# Patient Record
Sex: Female | Born: 1935 | Race: White | Hispanic: No | Marital: Married | Smoking: Never smoker
Health system: Southern US, Community
[De-identification: ages and names within clinical notes are randomized; demographics above are authoritative.]

## PROBLEM LIST (undated history)

## (undated) DIAGNOSIS — K219 Gastro-esophageal reflux disease without esophagitis: Secondary | ICD-10-CM

## (undated) DIAGNOSIS — E119 Type 2 diabetes mellitus without complications: Secondary | ICD-10-CM

## (undated) DIAGNOSIS — I10 Essential (primary) hypertension: Secondary | ICD-10-CM

## (undated) HISTORY — PX: TUBAL LIGATION: SHX77

## (undated) HISTORY — DX: Gastro-esophageal reflux disease without esophagitis: K21.9

## (undated) HISTORY — PX: JOINT REPLACEMENT: SHX530

## (undated) HISTORY — DX: Type 2 diabetes mellitus without complications: E11.9

## (undated) HISTORY — DX: Essential (primary) hypertension: I10

## (undated) HISTORY — PX: THYROIDECTOMY: SHX17

## (undated) HISTORY — PX: CHOLECYSTECTOMY: SHX55

## (undated) HISTORY — PX: ABDOMINAL HYSTERECTOMY: SHX81

## (undated) HISTORY — PX: OTHER SURGICAL HISTORY: SHX169

---

## 1998-06-30 ENCOUNTER — Ambulatory Visit (HOSPITAL_COMMUNITY): Admission: RE | Admit: 1998-06-30 | Discharge: 1998-06-30 | Payer: Self-pay | Admitting: Obstetrics and Gynecology

## 1999-07-16 ENCOUNTER — Other Ambulatory Visit: Admission: RE | Admit: 1999-07-16 | Discharge: 1999-07-16 | Payer: Self-pay | Admitting: Obstetrics & Gynecology

## 2000-09-22 ENCOUNTER — Encounter (INDEPENDENT_AMBULATORY_CARE_PROVIDER_SITE_OTHER): Payer: Self-pay | Admitting: Specialist

## 2000-09-22 ENCOUNTER — Ambulatory Visit (HOSPITAL_BASED_OUTPATIENT_CLINIC_OR_DEPARTMENT_OTHER): Admission: RE | Admit: 2000-09-22 | Discharge: 2000-09-22 | Payer: Self-pay | Admitting: Plastic Surgery

## 2000-09-27 ENCOUNTER — Other Ambulatory Visit: Admission: RE | Admit: 2000-09-27 | Discharge: 2000-09-27 | Payer: Self-pay | Admitting: Obstetrics & Gynecology

## 2001-04-25 ENCOUNTER — Encounter: Admission: RE | Admit: 2001-04-25 | Discharge: 2001-04-25 | Payer: Self-pay | Admitting: Specialist

## 2001-04-25 ENCOUNTER — Encounter: Payer: Self-pay | Admitting: Specialist

## 2001-12-20 ENCOUNTER — Encounter: Admission: RE | Admit: 2001-12-20 | Discharge: 2002-03-20 | Payer: Self-pay

## 2002-03-27 ENCOUNTER — Encounter: Admission: RE | Admit: 2002-03-27 | Discharge: 2002-06-25 | Payer: Self-pay

## 2002-06-27 ENCOUNTER — Encounter: Admission: RE | Admit: 2002-06-27 | Discharge: 2002-08-21 | Payer: Self-pay

## 2002-09-26 ENCOUNTER — Encounter: Admission: RE | Admit: 2002-09-26 | Discharge: 2002-12-25 | Payer: Self-pay | Admitting: Anesthesiology

## 2002-12-27 ENCOUNTER — Encounter: Admission: RE | Admit: 2002-12-27 | Discharge: 2003-03-27 | Payer: Self-pay

## 2004-05-11 ENCOUNTER — Ambulatory Visit (HOSPITAL_COMMUNITY): Admission: RE | Admit: 2004-05-11 | Discharge: 2004-05-11 | Payer: Self-pay | Admitting: Specialist

## 2008-06-12 ENCOUNTER — Inpatient Hospital Stay (HOSPITAL_COMMUNITY): Admission: RE | Admit: 2008-06-12 | Discharge: 2008-06-14 | Payer: Self-pay | Admitting: Obstetrics and Gynecology

## 2008-06-15 ENCOUNTER — Inpatient Hospital Stay (HOSPITAL_COMMUNITY): Admission: AD | Admit: 2008-06-15 | Discharge: 2008-06-17 | Payer: Self-pay | Admitting: Obstetrics and Gynecology

## 2008-06-15 ENCOUNTER — Ambulatory Visit: Payer: Self-pay | Admitting: Critical Care Medicine

## 2008-10-25 ENCOUNTER — Ambulatory Visit (HOSPITAL_BASED_OUTPATIENT_CLINIC_OR_DEPARTMENT_OTHER): Admission: RE | Admit: 2008-10-25 | Discharge: 2008-10-25 | Payer: Self-pay | Admitting: Family Medicine

## 2008-10-25 ENCOUNTER — Ambulatory Visit: Payer: Self-pay | Admitting: Diagnostic Radiology

## 2008-11-04 ENCOUNTER — Ambulatory Visit (HOSPITAL_BASED_OUTPATIENT_CLINIC_OR_DEPARTMENT_OTHER): Admission: RE | Admit: 2008-11-04 | Discharge: 2008-11-04 | Payer: Self-pay | Admitting: Family Medicine

## 2011-04-06 NOTE — Op Note (Signed)
NAME:  Brandy Morris, Brandy Morris              ACCOUNT NO.:  [id number]   MEDICAL RECORD NO.:  [id number]          PATIENT TYPE:  INP   LOCATION:  9308                          FACILITY:  [facility]   PHYSICIAN:  Guy Sandifer. Henderson Cloud, M.D. DATE OF BIRTH:  09-07-1935   DATE OF PROCEDURE:  DATE OF DISCHARGE:                               OPERATIVE REPORT   PREOPERATIVE DIAGNOSES:  1. Recurrent pelvic relaxation.  2. Stress urinary continence.   POSTOPERATIVE DIAGNOSES:  1. Recurrent pelvic relaxation.  2. Stress urinary continence.   PROCEDURE:  Anterior vaginal repair, colpopexy, uphold anterior vaginal  graft, and TVT mid urethral sling - [place].   SURGEON:  Guy Sandifer. Henderson Cloud, MD   ANESTHESIA:  General endotracheal intubation, Tyrone Apple. Malen Gauze, MD   ESTIMATED BLOOD LOSS:  150 mL.   INDICATIONS AND CONSENT:  This patient is a 76 year old married white  female G4, P4, status post hysterectomy, status post anteroposterior  repair with complaints of feeling as though something is falling out.  She also has complaints of leaking urine.  Urodynamics are consistent  with stress urinary continence.  Anteroposterior vaginal repair with  probable grafts as well as a transvaginal tape and mid urethral sling is  discussed with the patient.  Potential risks and complications were  discussed preoperatively including but limited to infection, organ  damage, bleeding requiring transfusion of blood products if possible,  HIV and hepatitis acquisition, DVT, PE, pneumonia, fistula formation,  and recurrent prolapse.  The sling with success and failure rates,  urinary retention, prolonged catheterization, intermittent  catheterization, and possible return to the operating room has been  reviewed.  Possibly, she has delayed healing, erosion, and dyspareunia  have also been reviewed.  All questions were answered and consent is  signed on the chart.   PROCEDURE:  The patient was taken to operating room where she is  identified, placed in dorsal supine position and general anesthesia was  induced via the endotracheal intubation.  She was then placed in dorsal  lithotomy position where she was prepped, bladder straight catheterized,  and she was draped in a sterile fashion.  Examination reveals the  primarily loss of level I support anteriorly.  Posteriorly, there is  still excellent support with redundant vaginal mucosa.  Weighted  speculum was placed.  The anterior vaginal mucosa was injected with 0.5%  lidocaine and 1:200,000 epinephrine.  A small inverted T-shaped incision  was made anteriorly.  Dissection was carried out bilaterally completely  elevating the bladder off that portion of the vaginal mucosa.  This was  carried out bilaterally to the point at the ischial spines and some  sacrospinous ligaments could be palpated bilaterally.  The uphold graft  was then placed through the sacrospinous ligaments bilaterally using the  Capio needle driver.  This was done at least 1 fingerbreadth medial to  the spines bilaterally.  The dilators and then the arms of the graft  were advanced bilaterally.  A single Monocryl 0 suture was used to  attach the apical portion of the graft in the midline to the vaginal  mucosa.  The arms were  then further advanced and advanced to properly  seat the graft.  It is laying quite flat with no folds.  It is  approximated to the bladder blade, but is not overly tight.  The sheaths  were then cut bilaterally and were totally removed completely along with  the suture itself within those.  The excess were trimmed.  Anterior  vaginal mucosa was then closed in a running locking fashion with 2-0  Vicryl suture.  Next, 2 points immediately suprapubic are marked with a  marking pen about 1 finger lateral to the midline bilaterally.  These  areas were then injected with the same lidocaine-epinephrine solution.  Stab incisions were made bilaterally at this point.  Vaginally, the   suburethral vaginal mucosa was injected with the same solution.  A small  midline incision was made, and dissection was carried out bilaterally to  the urogenital diaphragm.  It should be noted that the Foley catheter  has been replaced in the bladder at this point.  The bladder has been  completely drained, and the catheter was left in place.  Then using the  [place] system, the needles are placed bilaterally starting 1-2 cm superior  to the pubis bilaterally and marching the needle tip behind the  symphysis while the needle was rotated perpendicular to the pubis  thereby directing the needle tip away from the peritoneal cavity.  As  the needle passes below the pubis bilaterally, its passage was  controlled with the examining finger and the needle was exited through  the suburethral incision.  The Foley catheter was then removed, and  cystoscopy was carried out with 70-degree cystoscope.  A 306 degrees  inspection revealed no evidence of perforation or foreign body.  A good  puff of urine was noted from the ureters bilaterally.  Cystoscope was  removed, Foley catheter was replaced, the bladder was drained, and the  catheter was left in place.  The polypropylene mesh sling was then  attached to the needle tips which were then withdrawn through the  suprapubic incisions.  The sheath was then removed.  Proper tensioning  was noted on the sling with a Kelly clamp placed below the sling can  easily be rotated perpendicular to the floor without tension.  The sling  was otherwise sling nice and flat.  The excess arms were then trimmed at  the level of the skin bilaterally and these incisions were closed with  Dermabond.  The suburethral vaginal incision was closed with running  locking 2-0 Monocryl suture.  Vagina was packed with 1-inch gauze with  estrogen cream.  All counts were correct.  The patient was awakened,  taken to recovery room in stable condition.      Guy Sandifer Henderson Cloud, M.D.   Electronically Signed     JET/MEDQ  D:  06/12/2008  T:  06/12/2008  Job:  [id number]

## 2011-04-09 NOTE — Consult Note (Signed)
[facility]  Brandy Morris:    Brandy Morris, Brandy Morris Visit Number: [id number] MRN: [id number]          Service Type: PMG Location: [facility] Attending Physician:  Sondra Come Dictated by:   Sondra Come, D.O. Proc. Date: 12/21/01 Admit Date:  12/20/2001   CC:         Javier Docker, M.D.                          Consultation Report  Dear Dr. Shelle Iron:  Thank you very much for kindly referring Brandy Morris to the [facility] for evaluation.  Ms. Rollyson was evaluated in our clinic today.  Please refer to the following for details regarding the history and physical examination and treatment plan.  CHIEF COMPLAINT:  Back and knee pain.  HISTORY OF PRESENT ILLNESS:  Brandy Morris is a pleasant 76 year old right hand dominant female who [facility] to [facility] today complaining of nonradicular low back pain as well as right significantly greater than left knee pain.  Patients records indicate that an MRI showed spondylolisthesis at L4-5 with stenosis as well as degenerative disk disease at L5-S1 and multilevel facet arthropathy.  Brandy Morris apparently underwent lumbar epidural steroid injections x3 per Dr. Ethelene Hal at [facility].  These were done in September and October 2002.  Brandy Morris states that the first shot helped her for approximately a month but the second and third injections offered no significant relief.  The Brandy Morris also has been followed by Dr. Shelle Iron for knee pain and has undergone four knee injections of some sort with some temporary relief.  She states she has been diagnosed with osteoarthritis of her knees.  She was prescribed aquatic therapy and was walking in the pool but a flare up of knee pain caused her to discontinue this.  She denies that she was instructed on any particularly stretching exercises for her lower extremities and back.  She has been tried on  multiple medications including Vicodin at one point which she is not currently taking. She has been prescribed methocarbamol which offers no relief, Ultram 50 mg which gives her some moderate relief of her symptoms.  She also has taken ibuprofen in the past with some relief.  Currently, her pain is a 6/10 on a subjective scale.  Her function and quality of life indexes have declined. Her sleep is fair.  Her pain is worse with walking, bending, and standing significantly greater than sitting.  She also notices increased pain with working.  It is improved with rest and medications.  I review the health and history form and 14 point review of systems.  PAST MEDICAL HISTORY:  Hypertension and hypothyroidism.  PAST SURGICAL HISTORY:  Hysterectomy, bladder surgery, cholecystectomy, thyroidectomy, bilateral knee surgery.  FAMILY HISTORY:  Disability, hypertension.  SOCIAL HISTORY:  Brandy Morris denies smoking or alcohol use.  She is married and not currently working.  ALLERGIES:  No known drug allergies.  MEDICATIONS:  Hydrochlorothiazide, Synthroid, Lopressor, Prevacid, Vivelle DOT, methocarbamol, Ultram, ibuprofen p.r.n.  PHYSICAL EXAMINATION  GENERAL:  Obese female in no acute distress.  VITAL SIGNS:  Blood pressure 184/82, pulse 76, respirations 20, O2 saturation 96% on room air.  BACK:  Level pelvis without gross scoliosis.  There is decreased lumbar lordosis.  Palpatory examination reveals tenderness to palpation at the upper lumbar paraspinal muscles.  Range of motion is full in all planes without  pain on flexion.  There is mild pain on extension, but slight.  Side bending is greater on the left than the right.  Rotation is symmetric.  NEUROLOGIC:  Manual muscle testing is 5/5 bilateral lower extremities. Sensory examination is intact to light touch bilateral lower extremities. Muscle stretch reflexes are 2+/4 bilateral patellar, medial hamstrings, and Achilles.  Straight leg  raise is negative bilaterally.  Pearlean Brownie is negative bilaterally.  Brandy Morris is noted to have significantly tight hamstrings on the right greater than the left.  EXTREMITIES:  Examination of the hips reveals full active and passive range of motion.  There is full range of motion at the knees bilaterally.  No effusions noted.  There is mild tenderness to palpation bilateral anserine bursae and medial knee joint space.  There is mild pretibial edema bilaterally without any heat or erythema.  Distal pulses are present and symmetric bilaterally in the lower extremities.  IMPRESSION: 1. Chronic low back pain, multifactorial.  Brandy Morris has degenerative disk    disease of the lumbar spine with L4-5 spondylolisthesis resulting in some    spinal stenosis.  She does not, however, have any symptoms consistent with    neurogenic claudication at this time.  She has a significant myofascial    component and probable component of facet arthropathy contributing to her    current symptoms. 2. Osteoarthritis of the knees bilaterally with right greater than left knee    pain.  PLAN: 1. I will gather more information to enhance our database to include    radiologic imaging studies and procedural notes in regards to patients    spine injections. 2. Will prescribe physical therapy for range of motion, stretching,    strengthening, and a flexion to neutral based lumbar stabilization program    working towards a home exercise program two to three times per week for    four weeks. 3. Will prescribe Bextra 20 mg one p.o. q.d. #30 with one refill.  Brandy Morris    instructed to discontinue ibuprofen. 4. Continue Ultram 50 mg one to two as needed for pain q.6-8h. 5. Discontinue methocarbamol. 6. Brandy Morris instructed on proper body mechanics. 7. Brandy Morris to return to clinic in one month for reevaluation.  Will consider    further interventional procedures such as facet joint blocks if Brandy Morris has    no response to the  above noted measures.  Brandy Morris was educated on the above findings and recommendations and  understands.  There were no barriers to communication. Dictated by:   Sondra Come, D.O. Attending Physician:  Sondra Come DD:  12/21/01 TD:  12/22/01 Job: [id number]

## 2011-04-09 NOTE — Discharge Summary (Signed)
NAME:  Brandy Morris, Brandy Morris              ACCOUNT NO.:  [id number]   MEDICAL RECORD NO.:  [id number]          PATIENT TYPE:  INP   LOCATION:  9307                          FACILITY:  [facility]   PHYSICIAN:  Guy Sandifer. Henderson Cloud, M.D. DATE OF BIRTH:  November 06, 1935   DATE OF ADMISSION:  06/15/2008  DATE OF DISCHARGE:  06/17/2008                               DISCHARGE SUMMARY   ADMITTING DIAGNOSES:  1. Postoperative fever status post anterior vaginal repair and      transvaginal tension-free.  2. Shortness of breath   DISCHARGE DIAGNOSES:  1. Postoperative fever status post anterior vaginal repair and      transvaginal tension-free.  2. Shortness of breath   REASON FOR ADMISSION:  This patient is a 76 year old G4, P4 who  underwent anterior vaginal repair with a cold mesh graft and TVT on June 12, 2008.  She was discharged home on June 14, 2008, with her Foley  catheter in place.  She was taking Macrobid.  She was, otherwise, doing  fine.  She states that when she went home, she was feeling okay.  She  awoke in the night with chills and a feeling of being short of breath.  Sometimes, feels a little tight in her chest and has a dry cough.  There  is no chest pain, no leg pain, no nausea and vomiting, and no swelling  of the legs.   PHYSICAL EXAMINATION:  VITAL SIGNS:  Examination in triage revealed an  admitting temperature of 102.6.  It was 101.6 at the time of  examination.  Blood pressures 147/64, pulse was 97 and regular,  respiratory rate is 20.  GENERAL:  The patient is smiling in no acute distress.  She has an  occasional nonproductive cough.  LUNGS:  There are decreased lung sounds at the bases bilaterally.   LABORATORY DATA:  A chest CT to rule out pneumonia and pulmonary  embolism was felt to be nondiagnostic.  EKG was normal sinus rhythm.  White count is 17.7, hemoglobin 11.0.  CMET is essentially within normal  limits.  On room air, her arterial blood gas reveals a pH of 7.463, pCO2  of 38.1, pO2 of 53.6, and a bicarb of 25.5.  Blood cultures x2 were  done.   HOSPITAL COURSE:  The patient was admitted to the hospital.  IV fluids  were started.  About an hour after admission, temperature is 99.9 and  vital signs were stable.  O2 saturations were 100% on O2 mask.  Chest x-  ray was obtained.  Telephone conversation with Dr. Tyron Russell of Radiology  feels the x-ray is consistent with a probable slight pneumonia on the  right.  This was then discussed with the pulmonary intensivist on call.  Where she given for a sputum culture as well as Gram stain.  Urine  culture was sent.  Blood cultures were already been done.  Antibiotic  coverage was started with the recommendation of Pulmonary with  vancomycin and Avelox.  Pulmonary consultation was ordered to follow  later that day.  Later the same day, pulmonary consultation was  obtained.  Consult was suspicious of pneumonia.  There was a question of  pulmonary edema and a component of airway disease.  The pulmonary  embolism was doubtful in their opinion.  Urine was sent for pneumococcal  and Legionella antigens.  They cancelled the order for a repeat CT.  Antibiotics were continued.  IV fluids were restricted and the patient  was given a dose of IV Lasix.  On June 16, 2008, chest x-ray was  repeated.  There was a right lower lobe infiltrate.  Otherwise, there is  good aeration and decreased signs of pulmonary edema.  White count  decreased to 15.2 and hemoglobin was 9.4.  Urine antigen and blood  cultures were negative.  The impression was mild volume excess and a  right lower lobe postoperative pneumonia.  Antibiotics were changed to  oral Avelox and the vancomycin was discontinued.  Overall, the patient  was feeling better and her catheter was removed.  On June 17, 2008, she  was feeling better.  Vital signs were stable.  She was afebrile.  O2  saturation was 96% on room air.  She was voiding with residual volumes  less than  300 mL.  After another examination with Pulmonary, the patient  was discharged home.   CONDITION ON DISCHARGE:  Good.   DIET:  Regular as tolerated.   ACTIVITY:  No lifting, no operation of automobiles, and no vaginal  entry.   MEDICATIONS:  Avelox 400 mg daily.  Pain medication as needed upon  discharge previously.   FOLLOWUP:  In my office in 2 weeks as scheduled and with Pulmonary as  directed.      Guy Sandifer Henderson Cloud, M.D.  Electronically Signed     JET/MEDQ  D:  07/25/2008  T:  07/25/2008  Job:  [id number]

## 2011-04-09 NOTE — Consult Note (Signed)
   NAME:  Brandy Morris, Brandy Morris                        ACCOUNT NO.:  [id number]   MEDICAL RECORD NO.:  [id number]                   PATIENT TYPE:  REC   LOCATION:  [facility]                                  FACILITY:  [facility]   PHYSICIAN:  Sondra Come, D.O.                 DATE OF BIRTH:  06/14/1935   DATE OF CONSULTATION:  DATE OF DISCHARGE:                  PHYSICAL MEDICINE & REHABILITATION CONSULTATION   REASON FOR CONSULTATION:  The patient returns to clinic today for re-  evaluation.  She was last seen on 04/26/02.  Overall, she is doing quite well,  in terms of her low back pain and bilateral knee pain.  Her pain today is a  2/10 on a subjective scale.  She states that I am feeling better.  She  continues on Bextra 20 mg q.d. and Ultracet two q.a.m. and two in the  afternoon which seems to control her pain.  She denies any new neurologic  complaints.  I review health and history form and 14 point review of  systems.  The patient's function and quality of life indices have improve,  and her sleep is good.   PHYSICAL EXAMINATION:  GENERAL:  A healthy female in no acute distress.  VITAL SIGNS:  Blood pressure 156/47, pulse 55, respirations 18, O2  saturation 95% on room air.  NEUROLOGIC:  Manual muscle testing is 5/5 bilateral lower extremities.  Sensory examination is intact to light touch bilateral lower extremities.  Muscle stretch reflexes are symmetric bilateral lower extremities.  Minimal  tenderness to palpation bilateral lumbar paraspinal muscles.  Range of  motion of the lumbar spine is full in all planes.  Full range of motion of  bilateral knees with minimal tenderness at the medial joint spaces  bilaterally.  There is no ligamentous instability noted today.   IMPRESSION:  1. Chronic low back pain with underlying degenerative disk disease and     spinal stenosis without myelopathy, stable.  2. Osteoarthritis of bilateral knees, stable.   PLAN:  1. Continue Bextra 20 mg q.d.  2. Continue Ultracet two p.o. q.a.m. and two p.o. q.p.m. as needed.  3. Continue home exercise program.  4. The patient is to return to clinic on an as needed basis.   The patient was educated on the above findings and recommendations and  understands.  There were no barriers to communication.                                               Sondra Come, D.O.    JJW/MEDQ  D:  06/28/2002  T:  07/01/2002  Job:  [id number]

## 2011-04-09 NOTE — Consult Note (Signed)
[facility]  Patient:    Brandy Morris, Brandy Morris Visit Number: [id number] MRN: [id number]          Service Type: PMG Location: [facility] Attending Physician:  Sondra Come Dictated by:   Sondra Come, D.O. Proc. Date: 01/25/02 Admit Date:  12/20/2001   CC:         Javier Docker, M.D.   Consultation Report  [name] returns to clinic today as scheduled for reevaluation.  She was last seen on December 21, 2001.  Today [name] complains of diffuse pain in bilateral upper back, low back as well as bilateral upper and lower extremities which she gets from time to time.  She states that her right knee pain seems to be fluctuating in severity.  She will go back to physical therapy today where she is scheduled for aquatic therapy.  She states that her back feels somewhat weak.  Her pain is a 6/10 on a subjective scale.  She also relates a history of intermittent pain in her shoulders bilaterally and she describes this as a "twinge of pain every now and then."  She denies any bowel or bladder dysfunction.  Denies any paraesthesias except for chronic numbness in her toes.  She continues to take Ultram and Bextra which she states are helping.  I reviewed the health and history form and 14-point review of systems. Function and quality of life indexes remain essentially the same. Sleep is good.  PHYSICAL EXAMINATION  GENERAL:  Obese female in no acute distress.  VITAL SIGNS:  Blood pressure 156/55, pulse 72, respirations 18, O2 saturation 93% on room air.  BACK:  Palpatory examination reveals diffuse tenderness to palpation bilateral upper back and thoracolumbar paraspinals.  Patient also has some tenderness to palpation over the lateral epicondyles, gluteal muscles, and trochanteric bursae.  NEUROLOGIC:  Manual muscle testing is 5/5 bilateral upper and lower extremities.  Sensory examination is intact to light touch bilateral upper and lower extremities.   Muscle stretch reflexes are intact bilateral upper and lower extremities.  EXTREMITIES:  No heat, erythema, or edema in the upper and lower extremities.  LYMPH:  No cervical lymphadenopathy.  LABORATORIES:  The patient brings radiologic studies with her and I review the x-rays of her lumbar spine which reveal a slight scoliosis with osteopenia and facet arthropathy.  MRI of the lumbar spine was also reviewed which reveals multilevel degenerative disk changes with multilevel facet arthropathy and mild spinal stenosis at L3-4, moderate central spinal stenosis at L4-5.  I also reviewed the x-rays of her knees which revealed decreased medial compartments.  IMPRESSION: 1. Chronic low back pain, multifactorial with degenerative disk disease of the    lumbar spine and spinal stenosis without myelopathy. 2. Osteoarthritis bilateral knees, right greater than left. 3. Diffuse soft tissue pain, rule out fibromyalgia.  PLAN: 1. Continue Bextra and tramadol. 2. Recommend glucosamine and chondroitin. 3. Will obtain laboratory work-up to include thyroid studies, chemistry panel,    CBC, ANA, rheumatoid factor, ESR. 4. Continue with physical therapy. 5. Consider adding Humibid DM for the anti NMDA receptor antagonistic effect    of dextromethorphan. 6. Consider repeat lumbar epidural steroid injections versus facet blocks if    low back pain fails to improve with more conservative measures. 7. Patient to return to clinic in one month for reevaluation.  The patient was educated on the above findings and recommendations and understands.  There were no barriers to communication. Dictated by:   Quintin Alto.  Andrey Campanile, D.O. Attending Physician:  Sondra Come DD:  01/25/02 TD:  01/26/02 Job: [id number]

## 2011-04-09 NOTE — Consult Note (Signed)
[facility]  Patient:    Brandy Morris, Brandy Morris Visit Number: [id number] MRN: [id number]          Service Type: Attending:  Sondra Come, D.O. Dictated by:   Sondra Come, D.O. Proc. Date: 03/16/02                            Consultation Report  HISTORY OF PRESENT ILLNESS:  Brandy Morris returns to clinic sooner than scheduled secondary to severe pain in her left hip over the past two weeks. She complains of discomfort, especially with walking and inability to sleep on her left side.  She points to her left greater trochanter.  Her pain is an 8/10 on a subjective scale.  Her function and quality of life indices have declined somewhat with the new onset of hip pain.  She also continues to complain of pain in bilateral knees secondary to osteoarthritis and anserine bursitis.  I reviewed the health and history form and 14-point review of systems.  The patient continues to take Bextra 20 mg daily, as well as tramadol as needed.  PHYSICAL EXAMINATION:  An obese female in no acute distress.  Blood pressure 157/90, pulse 70, respirations 16, O2 saturation 95%.  Palpatory examination reveals significant tenderness to palpation over the left greater trochanter, reproducing the patients symptoms.  No new neurologic findings in the lower extremities, including motor, sensory, and reflexes.  The patient also continues to have tenderness to palpation over the anserine bursae bilaterally.  IMPRESSION: 1. Left trochanteric bursitis. 2. Osteoarthritis of bilateral knees with bilateral anserine bursitis. 3. Chronic low back pain, multifactorial, with degenerative disk disease of    the lumbar spine and spinal stenosis without myelopathy.  PLAN: 1. Trochanteric bursal steroid injection.  The procedure was described to the    patient in detail, including risks, benefits, limitations, and    alternatives.  The patient wishes to proceed.  The skin was prepped in the    usual  sterile fashion.  The trochanteric bursa was injected with 1 cc of    dexamethasone 4 mg/cc plus 1 cc of 0.25% Marcaine plus 2 cc of 1% lidocaine    using a 25 gauge, 3 inch needle.  The patient tolerated the procedure well.    Discharge instructions given.  The patient was instructed to use ice    liberally as needed tonight for rebound pain. 2. The patient is to return to the clinic in two weeks for re-evaluation and    possible bilateral pes anserine bursal steroid injections. 3. The patient is to continue Bextra 20 mg daily. 4. Ultracet one to two p.o. t.i.d. as needed, #100 with one refill.  The patient was educated on the above findings and recommendations and understands.  There were no barriers to communication. Dictated by:   Sondra Come, D.O. Attending:  Sondra Come, D.O. DD:  03/16/02 TD:  03/17/02 Job: [id number]

## 2011-04-09 NOTE — Consult Note (Signed)
NAME:  Brandy Morris, Brandy Morris                        ACCOUNT NO.:  [id number]   MEDICAL RECORD NO.:  [id number]                   PATIENT TYPE:  REC   LOCATION:  [facility]                                  FACILITY:  [facility]   PHYSICIAN:  Sondra Come, D.O.                 DATE OF BIRTH:  [birth year]   DATE OF CONSULTATION:  09/27/2002  DATE OF DISCHARGE:                                   CONSULTATION   The patient returns to clinic today for reevaluation.  She was last seen on  06/28/2002.  In the interim, she had been doing very well in terms of her low  back and bilateral knee pain until yesterday when she was running the vacuum  cleaner at home and now states that she is very sore all over, mainly in her  neck, low back, and lower extremities.  Her pain today is a 7/10 on a  subjective scale.  She has taken Bextra and Ultram intermittently over the  past few months with some improvement.  She is concerned about the cost of  the medications as she is now on Medicare and has to pay out of pocket for  her prescriptions.  We discussed this at length.  She also has not been to  aquatic therapy yet, and I encouraged her to participate in some type of  aerobic exercise activity including walking as tolerated and even stationary  bicycling.  Her function and quality of life indices remain stable.  Her  sleep remains somewhat poor at times.  I reviewed the health and history  form and 14-point Review of Systems.   PHYSICAL EXAMINATION:  GENERAL:  Obese female in no acute distress.  VITAL SIGNS:  Blood pressure is 150/58, pulse 88, respirations 18, O2  saturation 99% on room air.  MUSCULOSKELETAL:  Examination of the back reveals increased lumbar lordosis  with tenderness to palpation bilateral lumbar paraspinal muscles.  Range of  motion is limited secondary to mild discomfort.  Examination of the lower  extremities reveals no significant effusions or edema in the lower  extremities.  Range of motion of  the knees is full bilaterally with mild  discomfort in the medial joint spaces bilaterally.  No ligamentous  instability.  Neurologically intact in lower extremities including motor,  sensory, and muscle stretch reflexes are symmetric.   IMPRESSION:  1. Chronic low back pain with underlying degenerative disk disease and     spinal stenosis without myelopathy.  2. Osteoarthritis bilateral knees.   PLAN:  1. Will change Ultracet to Ultram 1 to 2 p.o. t.i.d. as needed, #100 with 2     refills.  2. Over-the-counter ibuprofen as directed as needed.  3. Continue home exercise program.  4. The patient is to return to clinic in three months as needed.   The patient was educated about findings and recommendations and understands.  No barriers to communication.  Sondra Come, D.O.    JJW/MEDQ  D:  09/27/2002  T:  09/28/2002  Job:  [id number]

## 2011-04-09 NOTE — Consult Note (Signed)
[facility]  Patient:    DALAYA, SUPPA Visit Number: [id number] MRN: [id number]          Service Type: PMG Location: [facility] Attending Physician:  Sondra Come Dictated by:   Sondra Come, D.O. Proc. Date: 04/26/02 Admit Date:  03/27/2002                            Consultation Report  Ms. Yingst returns to clinic for reevaluation.  She was last seen on March 16, 2002.  She states that her left hip pain is significantly better after the left trochanteric bursal and steroid injection.  She continues to have some mild discomfort in her knees but overall they are improved.  Her pain today is a 3/10 on a subjective scale.  Her function and quality of life indexes have improved significantly.  Her sleep fluctuates from poor to great.  She is interested in pursuing aquatic therapy for maintenance.  I reviewed the health and history form and 14 point review of systems.  She continues on Ultracet two p.o. q.a.m. and two p.o. q.p.m. as needed.  She is also continued on Bextra 20 mg daily.  PHYSICAL EXAMINATION  GENERAL:  Healthy female in no acute distress.  VITAL SIGNS:  Blood pressure 157/51, pulse 56, respirations 14, O2 saturation 95% on room air.  EXTREMITIES:  No heat, erythema, or edema in the lower extremities.  There is minimal tenderness to palpation over the left greater trochanter.  Minimal tenderness to palpation over the knees bilaterally with full range of motion. Patient does have some discomfort over the left anserine bursa.  BACK:  Minimal tenderness to palpation bilateral paraspinals.  NEUROLOGIC:  No neurologic deficits in the lower extremities including motor, sensory, and reflexes at this time.  IMPRESSION: 1. Left trochanteric bursitis, essentially resolved. 2. Osteoarthritis bilateral knees, improved. 3. Anserine bursitis, left. 4. Chronic low back pain with underlying degenerative disk disease and spinal    stenosis  without myelopathy.  PLAN: 1. Aquatic therapy for range of motion, stretching, strengthening, and low to    non-impact aerobic program two times per week for four weeks advancing to    an independent program. 2. Continue Ultracet as needed. 3. Continue Bextra as needed. 4. Instruct patient to use ice for 20 minutes b.i.d.-t.i.d. on her anserine    bursa.  If not improved, would consider local steroid injection. 5. Patient to return to clinic in two months for reevaluation.  Patient was educated on the above findings and recommendations and understands.  There were no barriers to communication. Dictated by:   Sondra Come, D.O. Attending Physician:  Sondra Come DD:  04/26/02 TD:  04/30/02 Job: [id number]

## 2011-04-09 NOTE — Consult Note (Signed)
NAME:  Brandy Morris, BRAM                        ACCOUNT NO.:  [id number]   MEDICAL RECORD NO.:  [id number]                   PATIENT TYPE:  REC   LOCATION:  [facility]                                  FACILITY:  [facility]   PHYSICIAN:  Zachary George, DO                      DATE OF BIRTH:  Jul 04, 1935   DATE OF CONSULTATION:  01/11/2003  DATE OF DISCHARGE:                                   CONSULTATION   REFERRING PHYSICIAN:  Jene Every, M.D.   REASON FOR CONSULTATION:  The patient returns to clinic today for  reevaluation.  She was last seen on September 27, 2002.  The patient continues  to complain of bilateral knee pain secondary to osteoarthritis.  She also  states that her back feels weak but is not really hurting her today.  In the  interim, she has had a coronary catheterization which she states did not  reveal any blockages.  Her pain today is 4/10 on a subjective scale.  Her  function and quality-of-life indices have improved.  Her sleep is fair.  She  seems to be doing well with her current medication regimen of Tramadol 50 mg  two b.i.d. and ibuprofen 200 mg two p.o. b.i.d.  When I saw her back in June  of 2003, I had recommended aquatic therapy and wrote her a prescription.  She still has not gone to aquatic therapy and I recommend that she pursue  this, although she has some financial concerns.  I recommend that [facility]  [facility] and she is concerned  about the temperature of the pool being too cold.  I reviewed health and  history form and 14-point review of systems.   PHYSICAL EXAMINATION:  GENERAL:  Physical examination reveals an obese  female in no acute distress.  VITAL SIGNS:  Blood pressure is 149/49, pulse 62, respirations 18, O2  saturation 97% on room air.  BACK:  Examination of the back reveals a level pelvis without scoliosis.  There is decreased lumbar lordosis.  There is minimal tenderness to  palpation in the lumbar paraspinal  muscles.  Range of motion is functional.  EXTREMITIES:  Examination of the knees does not reveal any heat, erythema or  edema.  There is full range of motion with no discomfort.  Negative  provocative maneuvers including anterior drawer, posterior drawer,  Lachman's, McMurray's.  There is no medial or lateral instability noted.  Neurologically intact in the lower extremities including motor and sensory.  Reflexes are symmetric in bilateral lower extremities.   IMPRESSION:  1. Chronic low back pain with underlying degenerative disk disease of the     lumbar spine and spinal stenosis without myelopathy.  The patient's low     back pain is improved.  2. Osteoarthritis, bilateral knees.   PLAN:  1. Continue Ultram one to  two p.o. b.i.d. as needed.  2. Continue over-the-counter ibuprofen as needed.  3. Continue home exercise program and consider aquatic therapy.  Consider     joining [facility] for aquatic exercise program.  4. Patient to return to clinic in three months for reevaluation.   Patient was educated in the above findings and recommendations and  understands.  No barriers to communication.                                               Zachary George, DO   JW/MEDQ  D:  01/11/2003  T:  01/11/2003  Job:  [id number]

## 2011-04-09 NOTE — Discharge Summary (Signed)
NAME:  Brandy Morris, Brandy Morris              ACCOUNT NO.:  [id number]   MEDICAL RECORD NO.:  [id number]          PATIENT TYPE:  INP   LOCATION:  9308                          FACILITY:  [facility]   PHYSICIAN:  Guy Sandifer. Henderson Cloud, M.D. DATE OF BIRTH:  26-Apr-1935   DATE OF ADMISSION:  06/12/2008  DATE OF DISCHARGE:  06/14/2008                               DISCHARGE SUMMARY   ADMITTING DIAGNOSES:  1. Pelvic relaxation.  2. Stress urinary incontinence.   DISCHARGE DIAGNOSES:  1. Pelvic relaxation.  2. Stress urinary incontinence.   PROCEDURE:  On June 12, 2008, is anterior vaginal repair, colpopexy,  insertion of Uphold vaginal graft, cystoscopy, and mid-urethral sling.   REASON FOR ADMISSION:  This patient is a 76 year old G4, P4 who  complains of symptoms of pelvic relaxation.  Urodynamics have been  consistent with stress urinary incontinence.  She is admitted for  surgical management.   HOSPITAL COURSE:  The patient undergoes the above procedure.  Estimated  blood loss is 150 mL.  On the evening of surgery, she is sore but  resting.  She is tolerating liquids, passing flatus.  Vital signs were  stable.  She is afebrile with a clear urine output.  On the first  postoperative day by that evening, she is feeling better.  No  complaints.  She is passing flatus and tolerating regular diet.  She is  beginning to void.  Vital signs are stable.  She is afebrile.  Hemoglobin is 10.3 and pathology is pending.  The volumes on her  residual urine is trending downward.  On the second postoperative day,  she is voiding about every 15-20 minutes.  She has no pain and only  scant bleeding, and complains of feeling constipated.  Vital signs are  stable and she is afebrile.  Abdomen is flat and soft with good bowel  sounds.  Urine culture was sent.  On further trials, the patient has  rising residual urine volumes.  She is discharged home with a Foley  catheter in place.  Instructions for catheter care were  given.  Instructions for discharge were also given.   CONDITION ON DISCHARGE:  Stable.   MEDICATIONS:  1. Percocet 5/225 mg #30 1-2 p.o. q.6 h p.r.n.  2. Macrobid #14 one p.o. b.i.d. while catheter is in place.   The patient is to call the office for problems including not limited to  heavy bleeding, persistent nausea and vomiting, increasing pain, or  temperature of 101 degrees.  Instructions for followup in the office are  also given.        Guy Sandifer Henderson Cloud, M.D.  Electronically Signed     JET/MEDQ  D:  07/25/2008  T:  07/25/2008  Job:  [id number]

## 2011-08-20 LAB — DIFFERENTIAL
Basophils Relative: 1
Eosinophils Absolute: 0.1
Eosinophils Absolute: 0.4
Eosinophils Relative: 1
Eosinophils Relative: 3
Lymphs Abs: 0.4 — ABNORMAL LOW
Lymphs Abs: 2.1
Monocytes Absolute: 0.1
Monocytes Absolute: 1.1 — ABNORMAL HIGH
Monocytes Relative: 7
Neutrophils Relative %: 76

## 2011-08-20 LAB — CBC
HCT: 29.5 — ABNORMAL LOW
HCT: 31.4 — ABNORMAL LOW
HCT: 39
Hemoglobin: 10.3 — ABNORMAL LOW
Hemoglobin: 12.7
Hemoglobin: 9.4 — ABNORMAL LOW
MCHC: 32.5
MCHC: 32.9
MCV: 96.6
Platelets: 232
Platelets: 268
Platelets: 268
RBC: 3.06 — ABNORMAL LOW
RBC: 3.2 — ABNORMAL LOW
RBC: 3.46 — ABNORMAL LOW
RDW: 15.3
RDW: 15.4
WBC: 11.3 — ABNORMAL HIGH
WBC: 12.6 — ABNORMAL HIGH
WBC: 17.7 — ABNORMAL HIGH

## 2011-08-20 LAB — URINALYSIS, ROUTINE W REFLEX MICROSCOPIC
Bilirubin Urine: NEGATIVE
Hgb urine dipstick: NEGATIVE
Ketones, ur: NEGATIVE
Protein, ur: NEGATIVE
Urobilinogen, UA: 0.2

## 2011-08-20 LAB — GLUCOSE, CAPILLARY
Glucose-Capillary: 162 — ABNORMAL HIGH
Glucose-Capillary: 173 — ABNORMAL HIGH
Glucose-Capillary: 175 — ABNORMAL HIGH

## 2011-08-20 LAB — BASIC METABOLIC PANEL
BUN: 13
Chloride: 100
GFR calc Af Amer: 58 — ABNORMAL LOW
GFR calc non Af Amer: 48 — ABNORMAL LOW
Potassium: 4.2

## 2011-08-20 LAB — CULTURE, BLOOD (ROUTINE X 2)
Culture: NO GROWTH
Culture: NO GROWTH

## 2011-08-20 LAB — LEGIONELLA ANTIGEN, URINE: Legionella Antigen, Urine: NEGATIVE

## 2011-08-20 LAB — BLOOD GAS, ARTERIAL
Acid-base deficit: 3.4 — ABNORMAL HIGH
Bicarbonate: 26.9 — ABNORMAL HIGH
pH, Arterial: 7.463 — ABNORMAL HIGH
pO2, Arterial: 53.6 — ABNORMAL LOW

## 2011-08-20 LAB — COMPREHENSIVE METABOLIC PANEL
ALT: 36 — ABNORMAL HIGH
AST: 38 — ABNORMAL HIGH
Albumin: 2.8 — ABNORMAL LOW
Alkaline Phosphatase: 84
BUN: 20
CO2: 30
Calcium: 8.6
Chloride: 99
GFR calc Af Amer: >60
GFR calc non Af Amer: 55 — ABNORMAL LOW
Glucose, Bld: 123 — ABNORMAL HIGH
Potassium: 4.3
Sodium: 137
Total Bilirubin: 0.4
Total Bilirubin: 1.1
Total Protein: 7

## 2011-08-20 LAB — PROTIME-INR
INR: 0.9
Prothrombin Time: 12.5

## 2011-08-20 LAB — URINE CULTURE
Colony Count: NO GROWTH
Culture: NO GROWTH

## 2013-10-16 ENCOUNTER — Ambulatory Visit (INDEPENDENT_AMBULATORY_CARE_PROVIDER_SITE_OTHER): Payer: Medicare Other

## 2013-10-16 VITALS — BP 189/82 | HR 66 | Resp 28 | Ht 65.0 in | Wt 277.0 lb

## 2013-10-16 DIAGNOSIS — E1140 Type 2 diabetes mellitus with diabetic neuropathy, unspecified: Secondary | ICD-10-CM

## 2013-10-16 DIAGNOSIS — E1149 Type 2 diabetes mellitus with other diabetic neurological complication: Secondary | ICD-10-CM

## 2013-10-16 DIAGNOSIS — E1142 Type 2 diabetes mellitus with diabetic polyneuropathy: Secondary | ICD-10-CM

## 2013-10-16 DIAGNOSIS — B351 Tinea unguium: Secondary | ICD-10-CM

## 2013-10-16 DIAGNOSIS — M79609 Pain in unspecified limb: Secondary | ICD-10-CM

## 2013-10-16 NOTE — Progress Notes (Signed)
  Subjective:    Patient ID: Brandy Morris, female    DOB: 1935/01/06, 78 y.o.   MRN: [id number] "I guess trim these eagle claws."  HPI Comments: N  Diabetic, long L  Debridement B/L D  2012 O  Gradually C  Gotten Worse A  None T  Tried to cut them ourselves     Review of Systems  Constitutional: Negative.   HENT: Positive for sinus pressure.   Eyes: Negative.   Respiratory: Positive for wheezing.   Cardiovascular:       Poor circulation  Gastrointestinal: Abdominal pain: reflux.       Irritable bowel  Genitourinary:       Leaky bladder  Musculoskeletal: Positive for joint swelling.       Osteoporosis  Skin:       Change in nails  Allergic/Immunologic: Negative.   Hematological: Negative.   Psychiatric/Behavioral: Negative.   All other systems reviewed and are negative.       Objective:   Physical Exam Vascular status is diminished with absent PT pulse bilateral intact dorsalis pedis pulse one over 4 bilateral. Refill timed 3-4 seconds all digits skin temperature warm turgor normal no edema noted no varicosities noted neurologically epicritic and proprioceptive sensations diminished on Semmes Weinstein testing. To the digits and forefoot bilateral. Orthopedic exam unremarkable noncontributory mild digital contractures are identified no fractures or other os abnormalities noted. Dermatologically nails thick brittle friable incurvated ingrowing and tender on palpation and with enclosed shoe wear and ambulation. No open wounds or ulcerations noted current time.       Assessment & Plan:  Diabetes with peripheral neuropathy, onychomycosis criptotic ingrowing criptotic nails 1 through 5 bilateral. Plan at this time nails debrided x10 the presence of diabetes and complicating factors return for follow palliative care in 3 months for continued nail care as needed maintain accommodative shoe here at all times. Recommended topical antifungal Fungi-Nail or formula 3 to the  affected nails twice daily for 12 months duration followup in 3 months next  Alvan Dame DPM

## 2013-10-16 NOTE — Patient Instructions (Signed)
Diabetes and Foot Care Diabetes may cause you to have problems because of poor blood supply (circulation) to your feet and legs. This may cause the skin on your feet to become thinner, break easier, and heal more slowly. Your skin may become dry, and the skin may peel and crack. You may also have nerve damage in your legs and feet causing decreased feeling in them. You may not notice minor injuries to your feet that could lead to infections or more serious problems. Taking care of your feet is one of the most important things you can do for yourself.  HOME CARE INSTRUCTIONS  Wear shoes at all times, even in the house. Do not go barefoot. Bare feet are easily injured.  Check your feet daily for blisters, cuts, and redness. If you cannot see the bottom of your feet, use a mirror or ask someone for help.  Wash your feet with warm water (do not use hot water) and mild soap. Then pat your feet and the areas between your toes until they are completely dry. Do not soak your feet as this can dry your skin.  Apply a moisturizing lotion or petroleum jelly (that does not contain alcohol and is unscented) to the skin on your feet and to dry, brittle toenails. Do not apply lotion between your toes.  Trim your toenails straight across. Do not dig under them or around the cuticle. File the edges of your nails with an emery board or nail file.  Do not cut corns or calluses or try to remove them with medicine.  Wear clean socks or stockings every day. Make sure they are not too tight. Do not wear knee-high stockings since they may decrease blood flow to your legs.  Wear shoes that fit properly and have enough cushioning. To break in new shoes, wear them for just a few hours a day. This prevents you from injuring your feet. Always look in your shoes before you put them on to be sure there are no objects inside.  Do not cross your legs. This may decrease the blood flow to your feet.  If you find a minor scrape,  cut, or break in the skin on your feet, keep it and the skin around it clean and dry. These areas may be cleansed with mild soap and water. Do not cleanse the area with peroxide, alcohol, or iodine.  When you remove an adhesive bandage, be sure not to damage the skin around it.  If you have a wound, look at it several times a day to make sure it is healing.  Do not use heating pads or hot water bottles. They may burn your skin. If you have lost feeling in your feet or legs, you may not know it is happening until it is too late.  Make sure your health care provider performs a complete foot exam at least annually or more often if you have foot problems. Report any cuts, sores, or bruises to your health care provider immediately. SEEK MEDICAL CARE IF:   You have an injury that is not healing.  You have cuts or breaks in the skin.  You have an ingrown nail.  You notice redness on your legs or feet.  You feel burning or tingling in your legs or feet.  You have pain or cramps in your legs and feet.  Your legs or feet are numb.  Your feet always feel cold. SEEK IMMEDIATE MEDICAL CARE IF:   There is increasing redness,   swelling, or pain in or around a wound.  There is a red line that goes up your leg.  Pus is coming from a wound.  You develop a fever or as directed by your health care provider.  You notice a bad smell coming from an ulcer or wound. Document Released: 11/05/2000 Document Revised: 07/11/2013 Document Reviewed: 04/17/2013 [facility] Patient Information 2014 [place], [place].   Onychomycosis/Fungal Toenails  WHAT IS IT? An infection that lies within the keratin of your nail plate that is caused by a fungus.  WHY ME? Fungal infections affect all ages, sexes, races, and creeds.  There may be many factors that predispose you to a fungal infection such as age, coexisting medical conditions such as diabetes, or an autoimmune disease; stress, medications, fatigue, genetics,  etc.  Bottom line: fungus thrives in a warm, moist environment and your shoes offer such a location.  IS IT CONTAGIOUS? Theoretically, yes.  You do not want to share shoes, nail clippers or files with someone who has fungal toenails.  Walking around barefoot in the same room or sleeping in the same bed is unlikely to transfer the organism.  It is important to realize, however, that fungus can spread easily from one nail to the next on the same foot.  HOW DO WE TREAT THIS?  There are several ways to treat this condition.  Treatment may depend on many factors such as age, medications, pregnancy, liver and kidney conditions, etc.  It is best to ask your doctor which options are available to you.  1. No treatment.   Unlike many other medical concerns, you can live with this condition.  However for many people this can be a painful condition and may lead to ingrown toenails or a bacterial infection.  It is recommended that you keep the nails cut short to help reduce the amount of fungal nail. 2. Topical treatment.  These range from herbal remedies to prescription strength nail lacquers.  About 40-50% effective, topicals require twice daily application for approximately 9 to 12 months or until an entirely new nail has grown out.  The most effective topicals are medical grade medications available through physicians offices. 3. Oral antifungal medications.  With an 80-90% cure rate, the most common oral medication requires 3 to 4 months of therapy and stays in your system for a year as the new nail grows out.  Oral antifungal medications do require blood work to make sure it is a safe drug for you.  A liver function panel will be performed prior to starting the medication and after the first month of treatment.  It is important to have the blood work performed to avoid any harmful side effects.  In general, this medication safe but blood work is required. 4. Laser Therapy.  This treatment is performed by applying  a specialized laser to the affected nail plate.  This therapy is noninvasive, fast, and non-painful.  It is not covered by insurance and is therefore, out of pocket.  The results have been very good with a 80-95% cure rate.  The [facility] is the only practice in the area to offer this therapy. 5. Permanent Nail Avulsion.  Removing the entire nail so that a new nail will not grow back. 6. Fungi-Nail or formula 3 topical antifungal skin be applied to the affected nails twice daily for 12 months duration

## 2017-06-24 ENCOUNTER — Encounter (HOSPITAL_BASED_OUTPATIENT_CLINIC_OR_DEPARTMENT_OTHER): Payer: Self-pay

## 2019-11-27 ENCOUNTER — Inpatient Hospital Stay (HOSPITAL_COMMUNITY): Payer: Medicare HMO

## 2019-11-27 ENCOUNTER — Encounter (HOSPITAL_COMMUNITY): Payer: Self-pay

## 2019-11-27 ENCOUNTER — Other Ambulatory Visit: Payer: Self-pay

## 2019-11-27 ENCOUNTER — Emergency Department (HOSPITAL_COMMUNITY): Payer: Medicare HMO

## 2019-11-27 ENCOUNTER — Inpatient Hospital Stay (HOSPITAL_COMMUNITY)
Admission: EM | Admit: 2019-11-27 | Discharge: 2019-12-01 | DRG: 177 | Disposition: A | Discharge status: Home Health | Payer: Medicare HMO | Attending: Student | Admitting: Student

## 2019-11-27 DIAGNOSIS — E890 Postprocedural hypothyroidism: Secondary | ICD-10-CM | POA: Diagnosis present

## 2019-11-27 DIAGNOSIS — E860 Dehydration: Secondary | ICD-10-CM | POA: Diagnosis present

## 2019-11-27 DIAGNOSIS — N1831 Chronic kidney disease, stage 3a: Secondary | ICD-10-CM | POA: Diagnosis present

## 2019-11-27 DIAGNOSIS — U071 COVID-19: Principal | ICD-10-CM | POA: Diagnosis present

## 2019-11-27 DIAGNOSIS — Z885 Allergy status to narcotic agent status: Secondary | ICD-10-CM

## 2019-11-27 DIAGNOSIS — J189 Pneumonia, unspecified organism: Secondary | ICD-10-CM | POA: Diagnosis not present

## 2019-11-27 DIAGNOSIS — E039 Hypothyroidism, unspecified: Secondary | ICD-10-CM | POA: Diagnosis not present

## 2019-11-27 DIAGNOSIS — J9601 Acute respiratory failure with hypoxia: Secondary | ICD-10-CM

## 2019-11-27 DIAGNOSIS — L89312 Pressure ulcer of right buttock, stage 2: Secondary | ICD-10-CM | POA: Diagnosis present

## 2019-11-27 DIAGNOSIS — E1121 Type 2 diabetes mellitus with diabetic nephropathy: Secondary | ICD-10-CM | POA: Diagnosis not present

## 2019-11-27 DIAGNOSIS — E1122 Type 2 diabetes mellitus with diabetic chronic kidney disease: Secondary | ICD-10-CM | POA: Diagnosis present

## 2019-11-27 DIAGNOSIS — E1165 Type 2 diabetes mellitus with hyperglycemia: Secondary | ICD-10-CM | POA: Diagnosis present

## 2019-11-27 DIAGNOSIS — I10 Essential (primary) hypertension: Secondary | ICD-10-CM | POA: Diagnosis not present

## 2019-11-27 DIAGNOSIS — R0600 Dyspnea, unspecified: Secondary | ICD-10-CM | POA: Diagnosis not present

## 2019-11-27 DIAGNOSIS — Z79890 Hormone replacement therapy (postmenopausal): Secondary | ICD-10-CM | POA: Diagnosis not present

## 2019-11-27 DIAGNOSIS — R5381 Other malaise: Secondary | ICD-10-CM | POA: Diagnosis not present

## 2019-11-27 DIAGNOSIS — Z794 Long term (current) use of insulin: Secondary | ICD-10-CM

## 2019-11-27 DIAGNOSIS — N179 Acute kidney failure, unspecified: Secondary | ICD-10-CM | POA: Diagnosis present

## 2019-11-27 DIAGNOSIS — Z791 Long term (current) use of non-steroidal anti-inflammatories (NSAID): Secondary | ICD-10-CM | POA: Diagnosis not present

## 2019-11-27 DIAGNOSIS — Z6841 Body Mass Index (BMI) 40.0 and over, adult: Secondary | ICD-10-CM

## 2019-11-27 DIAGNOSIS — K219 Gastro-esophageal reflux disease without esophagitis: Secondary | ICD-10-CM | POA: Diagnosis present

## 2019-11-27 DIAGNOSIS — J1282 Pneumonia due to coronavirus disease 2019: Secondary | ICD-10-CM | POA: Diagnosis present

## 2019-11-27 DIAGNOSIS — K2100 Gastro-esophageal reflux disease with esophagitis, without bleeding: Secondary | ICD-10-CM

## 2019-11-27 DIAGNOSIS — J9600 Acute respiratory failure, unspecified whether with hypoxia or hypercapnia: Secondary | ICD-10-CM | POA: Diagnosis present

## 2019-11-27 DIAGNOSIS — Z91040 Latex allergy status: Secondary | ICD-10-CM | POA: Diagnosis not present

## 2019-11-27 DIAGNOSIS — Z7984 Long term (current) use of oral hypoglycemic drugs: Secondary | ICD-10-CM

## 2019-11-27 DIAGNOSIS — Z79899 Other long term (current) drug therapy: Secondary | ICD-10-CM

## 2019-11-27 DIAGNOSIS — L8990 Pressure ulcer of unspecified site, unspecified stage: Secondary | ICD-10-CM | POA: Insufficient documentation

## 2019-11-27 DIAGNOSIS — I890 Lymphedema, not elsewhere classified: Secondary | ICD-10-CM | POA: Diagnosis present

## 2019-11-27 DIAGNOSIS — I129 Hypertensive chronic kidney disease with stage 1 through stage 4 chronic kidney disease, or unspecified chronic kidney disease: Secondary | ICD-10-CM | POA: Diagnosis present

## 2019-11-27 DIAGNOSIS — L8992 Pressure ulcer of unspecified site, stage 2: Secondary | ICD-10-CM | POA: Diagnosis not present

## 2019-11-27 DIAGNOSIS — R7989 Other specified abnormal findings of blood chemistry: Secondary | ICD-10-CM | POA: Diagnosis not present

## 2019-11-27 DIAGNOSIS — N183 Chronic kidney disease, stage 3 unspecified: Secondary | ICD-10-CM

## 2019-11-27 LAB — CBC WITH DIFFERENTIAL/PLATELET
Abs Immature Granulocytes: 0.08 10*3/uL — ABNORMAL HIGH (ref 0.00–0.07)
Basophils Absolute: 0 10*3/uL (ref 0.0–0.1)
Basophils Relative: 0 %
Eosinophils Absolute: 0.4 10*3/uL (ref 0.0–0.5)
Eosinophils Relative: 3 %
HCT: 40 % (ref 36.0–46.0)
Hemoglobin: 12.4 g/dL (ref 12.0–15.0)
Immature Granulocytes: 1 %
Lymphocytes Relative: 20 %
Lymphs Abs: 2.6 10*3/uL (ref 0.7–4.0)
MCH: 29.9 pg (ref 26.0–34.0)
MCHC: 31 g/dL (ref 30.0–36.0)
MCV: 96.4 fL (ref 80.0–100.0)
Monocytes Absolute: 0.7 10*3/uL (ref 0.1–1.0)
Monocytes Relative: 5 %
Neutro Abs: 9.1 10*3/uL — ABNORMAL HIGH (ref 1.7–7.7)
Neutrophils Relative %: 71 %
Platelets: 185 10*3/uL (ref 150–400)
RBC: 4.15 MIL/uL (ref 3.87–5.11)
RDW: 14.8 % (ref 11.5–15.5)
WBC: 12.9 10*3/uL — ABNORMAL HIGH (ref 4.0–10.5)
nRBC: 0 % (ref 0.0–0.2)

## 2019-11-27 LAB — BRAIN NATRIURETIC PEPTIDE: B Natriuretic Peptide: 50.5 pg/mL (ref 0.0–100.0)

## 2019-11-27 LAB — FERRITIN: Ferritin: 43 ng/mL (ref 11–307)

## 2019-11-27 LAB — COMPREHENSIVE METABOLIC PANEL
ALT: 18 U/L (ref 0–44)
AST: 36 U/L (ref 15–41)
Albumin: 3 g/dL — ABNORMAL LOW (ref 3.5–5.0)
Alkaline Phosphatase: 217 U/L — ABNORMAL HIGH (ref 38–126)
Anion gap: 9 (ref 5–15)
BUN: 18 mg/dL (ref 8–23)
CO2: 30 mmol/L (ref 22–32)
Calcium: 8.5 mg/dL — ABNORMAL LOW (ref 8.9–10.3)
Chloride: 102 mmol/L (ref 98–111)
Creatinine, Ser: 1.08 mg/dL — ABNORMAL HIGH (ref 0.44–1.00)
GFR calc Af Amer: 55 mL/min — ABNORMAL LOW (ref >60)
GFR calc non Af Amer: 48 mL/min — ABNORMAL LOW (ref >60)
Glucose, Bld: 85 mg/dL (ref 70–99)
Potassium: 4.3 mmol/L (ref 3.5–5.1)
Sodium: 141 mmol/L (ref 135–145)
Total Bilirubin: 0.7 mg/dL (ref 0.3–1.2)
Total Protein: 7.2 g/dL (ref 6.5–8.1)

## 2019-11-27 LAB — TRIGLYCERIDES: Triglycerides: 67 mg/dL (ref <150)

## 2019-11-27 LAB — D-DIMER, QUANTITATIVE: D-Dimer, Quant: 9.15 ug/mL-FEU — ABNORMAL HIGH (ref 0.00–0.50)

## 2019-11-27 LAB — LACTATE DEHYDROGENASE: LDH: 213 U/L — ABNORMAL HIGH (ref 98–192)

## 2019-11-27 LAB — POC SARS CORONAVIRUS 2 AG -  ED: SARS Coronavirus 2 Ag: NEGATIVE

## 2019-11-27 LAB — PROCALCITONIN: Procalcitonin: <0.1 ng/mL

## 2019-11-27 LAB — LACTIC ACID, PLASMA: Lactic Acid, Venous: 0.9 mmol/L (ref 0.5–1.9)

## 2019-11-27 LAB — CBG MONITORING, ED: Glucose-Capillary: 111 mg/dL — ABNORMAL HIGH (ref 70–99)

## 2019-11-27 LAB — C-REACTIVE PROTEIN: CRP: 9.2 mg/dL — ABNORMAL HIGH (ref <1.0)

## 2019-11-27 LAB — FIBRINOGEN: Fibrinogen: 337 mg/dL (ref 210–475)

## 2019-11-27 LAB — SARS CORONAVIRUS 2 (TAT 6-24 HRS): SARS Coronavirus 2: POSITIVE — AB

## 2019-11-27 MED ORDER — PANTOPRAZOLE SODIUM 40 MG PO TBEC
40.0000 mg | DELAYED_RELEASE_TABLET | Freq: Every day | ORAL | Status: DC
  Administered 2019-11-28 – 2019-12-01 (×4): 40 mg via ORAL
  Filled 2019-11-27 (×4): qty 1

## 2019-11-27 MED ORDER — CARVEDILOL 12.5 MG PO TABS
12.5000 mg | ORAL_TABLET | Freq: Every day | ORAL | Status: DC
  Administered 2019-11-27 – 2019-11-28 (×2): 12.5 mg via ORAL
  Filled 2019-11-27 (×3): qty 1

## 2019-11-27 MED ORDER — INSULIN ASPART 100 UNIT/ML ~~LOC~~ SOLN
0.0000 [IU] | Freq: Three times a day (TID) | SUBCUTANEOUS | Status: DC
  Administered 2019-11-28: 18:00:00 2 [IU] via SUBCUTANEOUS
  Administered 2019-11-28: 3 [IU] via SUBCUTANEOUS
  Administered 2019-11-28: 10:00:00 2 [IU] via SUBCUTANEOUS
  Administered 2019-11-29 – 2019-11-30 (×3): 1 [IU] via SUBCUTANEOUS
  Administered 2019-11-30: 17:00:00 2 [IU] via SUBCUTANEOUS
  Filled 2019-11-27: qty 0.09

## 2019-11-27 MED ORDER — SODIUM CHLORIDE 0.9 % IV SOLN
1000.0000 mL | INTRAVENOUS | Status: DC
  Administered 2019-11-27: 14:00:00 1000 mL via INTRAVENOUS

## 2019-11-27 MED ORDER — FERROUS SULFATE 325 (65 FE) MG PO TABS
325.0000 mg | ORAL_TABLET | Freq: Two times a day (BID) | ORAL | Status: DC
  Administered 2019-11-28 – 2019-12-01 (×7): 325 mg via ORAL
  Filled 2019-11-27 (×8): qty 1

## 2019-11-27 MED ORDER — ASCORBIC ACID 500 MG PO TABS
500.0000 mg | ORAL_TABLET | Freq: Every day | ORAL | Status: DC
  Administered 2019-11-28 – 2019-12-01 (×4): 500 mg via ORAL
  Filled 2019-11-27 (×4): qty 1

## 2019-11-27 MED ORDER — SODIUM CHLORIDE 0.9 % IV SOLN
500.0000 mg | INTRAVENOUS | Status: DC
  Administered 2019-11-27 – 2019-11-28 (×2): 500 mg via INTRAVENOUS
  Filled 2019-11-27 (×2): qty 500

## 2019-11-27 MED ORDER — DEXAMETHASONE SODIUM PHOSPHATE 10 MG/ML IJ SOLN
6.0000 mg | Freq: Every day | INTRAMUSCULAR | Status: DC
  Administered 2019-11-27 – 2019-12-01 (×5): 6 mg via INTRAVENOUS
  Filled 2019-11-27 (×5): qty 1

## 2019-11-27 MED ORDER — AMLODIPINE BESYLATE 5 MG PO TABS
5.0000 mg | ORAL_TABLET | Freq: Every day | ORAL | Status: DC
  Administered 2019-11-28 – 2019-12-01 (×4): 5 mg via ORAL
  Filled 2019-11-27 (×4): qty 1

## 2019-11-27 MED ORDER — HYDRALAZINE HCL 20 MG/ML IJ SOLN
10.0000 mg | Freq: Once | INTRAMUSCULAR | Status: AC
  Administered 2019-11-27: 21:00:00 10 mg via INTRAVENOUS
  Filled 2019-11-27: qty 0.5

## 2019-11-27 MED ORDER — ZINC SULFATE 220 (50 ZN) MG PO CAPS
220.0000 mg | ORAL_CAPSULE | Freq: Every day | ORAL | Status: DC
  Administered 2019-11-28 – 2019-12-01 (×4): 220 mg via ORAL
  Filled 2019-11-27 (×4): qty 1

## 2019-11-27 MED ORDER — INSULIN ASPART 100 UNIT/ML ~~LOC~~ SOLN
0.0000 [IU] | Freq: Every day | SUBCUTANEOUS | Status: DC
  Filled 2019-11-27: qty 0.05

## 2019-11-27 MED ORDER — SODIUM CHLORIDE 0.9 % IV SOLN: 2.0000 g | INTRAVENOUS | Status: DC

## 2019-11-27 MED ORDER — SODIUM CHLORIDE 0.9 % IV SOLN
2.0000 g | INTRAVENOUS | Status: DC
  Administered 2019-11-27 – 2019-11-28 (×2): 2 g via INTRAVENOUS
  Filled 2019-11-27: qty 20
  Filled 2019-11-27: qty 2

## 2019-11-27 MED ORDER — LISINOPRIL 20 MG PO TABS
40.0000 mg | ORAL_TABLET | Freq: Every evening | ORAL | Status: DC
  Administered 2019-11-27: 40 mg via ORAL
  Filled 2019-11-27: qty 2

## 2019-11-27 MED ORDER — SODIUM CHLORIDE (PF) 0.9 % IJ SOLN
INTRAMUSCULAR | Status: AC
  Filled 2019-11-27: qty 50

## 2019-11-27 MED ORDER — SODIUM CHLORIDE 0.9 % IV SOLN
200.0000 mg | Freq: Once | INTRAVENOUS | Status: AC
  Administered 2019-11-27: 200 mg via INTRAVENOUS
  Filled 2019-11-27: qty 40
  Filled 2019-11-27: qty 200

## 2019-11-27 MED ORDER — LEVOTHYROXINE SODIUM 50 MCG PO TABS
150.0000 ug | ORAL_TABLET | Freq: Every evening | ORAL | Status: DC
  Administered 2019-11-27 – 2019-11-30 (×4): 150 ug via ORAL
  Filled 2019-11-27 (×5): qty 1

## 2019-11-27 MED ORDER — IOHEXOL 350 MG/ML SOLN
100.0000 mL | Freq: Once | INTRAVENOUS | Status: AC | PRN
  Administered 2019-11-27: 22:00:00 100 mL via INTRAVENOUS

## 2019-11-27 MED ORDER — SODIUM CHLORIDE 0.9 % IV SOLN: 500.0000 mg | INTRAVENOUS | Status: DC

## 2019-11-27 MED ORDER — SODIUM CHLORIDE 0.9 % IV SOLN
100.0000 mg | Freq: Every day | INTRAVENOUS | Status: AC
  Administered 2019-11-28 – 2019-12-01 (×4): 100 mg via INTRAVENOUS
  Filled 2019-11-27 (×4): qty 100

## 2019-11-27 MED ORDER — SODIUM CHLORIDE 0.9 % IV SOLN
1000.0000 mL | INTRAVENOUS | Status: DC
  Administered 2019-11-27: 17:00:00 1000 mL via INTRAVENOUS

## 2019-11-27 NOTE — ED Notes (Signed)
Given food.

## 2019-11-27 NOTE — H&P (Signed)
History and Physical        Hospital Admission Note Date: 11/27/2019  Patient name: Brandy Morris Medical record number: [id number] Date of birth: 01-07-1935 Age: 85 y.o. Gender: female  PCP: Karleen Hampshire., MD    Patient coming from: Drs. Office  I have reviewed all records in the [facility].    Chief Complaint:  Shortness of breath, productive cough for the last 2 weeks  HPI: Patient is a 85 year old female with history of diabetes mellitus, hypertension, GERD, lower extremity lymphedema presented from her doctor's office for shortness of breath with productive cough.  Her O2 sats were found to be 87% on room air, not on O2 at home.  Per patient, she has exposure to the Covid, daughter has Covid and other family members as well.  She had gone to wound care for her lower extremity lymphedema with her daughter about 3 weeks ago.  In the last 2 weeks, patient has been feeling shortness of breath, progressively worsening with coughing.  She did have subjective fevers and chills but no chest pain, no nausea vomiting or diarrhea. Of note, patient's husband has Covid+ and is currently being admitted Point-of-care Covid test for the patient is negative, confirmatory test is pending  ED work-up/course: Temp 98.1, respiratory 20, pulse 71, BP 168/78, O2 sats currently 95% on 2 L Sodium 141, potassium 4.3, creatinine 1.08, alk phos 217, AST 36, ALT 18, BNP 15.5, LDH elevated 213, ferritin 43, CRP 9.2, procalcitonin <0.1, D-dimer 9.15, fibrinogen 337  Chest x-ray showed ill-defined opacity medial right base, likely due to developing pneumonia lungs elsewhere clear cardiac silhouette normal  Review of Systems: Positives marked in 'bold' Constitutional: + fever, chills, diaphoresis, poor appetite and fatigue.  HEENT: Denies photophobia, eye pain, redness, hearing loss, ear pain,  congestion, sore throat, rhinorrhea, sneezing, mouth sores, trouble swallowing, neck pain, neck stiffness and tinnitus.   Respiratory: Please see HPI Cardiovascular: Denies chest pain, palpitations, has chronic leg swelling.  Gastrointestinal: Denies nausea, vomiting, abdominal pain, diarrhea, constipation, blood in stool and abdominal distention.  Genitourinary: Denies dysuria, urgency, frequency, hematuria, flank pain and difficulty urinating.  Musculoskeletal:+ myalgias, denies back pain, joint swelling, arthralgias and gait problem.  Skin: Denies pallor, rash and wound.  Neurological: No syncope or seizures.  + Generalized weakness Hematological: Denies adenopathy. Easy bruising, personal or family bleeding history  Psychiatric/Behavioral: Denies suicidal ideation, mood changes, confusion, nervousness, sleep disturbance and agitation  Past Medical History: Past Medical History:  Diagnosis Date  . Diabetes mellitus without complication ([place])   . GERD (gastroesophageal reflux disease)   . Hypertension     Past Surgical History:  Procedure Laterality Date  . ABDOMINAL HYSTERECTOMY    . Bladder Tack    . CHOLECYSTECTOMY    . JOINT REPLACEMENT    . THYROIDECTOMY    . TUBAL LIGATION      Medications: Prior to Admission medications   Medication Sig Start Date End Date Taking? Authorizing Provider  amLODipine (NORVASC) 5 MG tablet Take 5 mg by mouth daily. 03/14/16  Yes [provider]  aspirin 325 MG EC tablet Take 325 mg by mouth daily as needed for pain.  03/03/19  Yes [provider]  carvedilol (COREG) 6.25 MG tablet Take 12.5 mg by mouth at bedtime. 10/30/19  Yes [provider]  ferrous sulfate 325 (65 FE) MG tablet Take 325 mg by mouth 2 (two) times daily. 08/20/19  Yes [provider]  furosemide (LASIX) 40 MG tablet Take 40 mg by mouth daily. 11/25/19  Yes [provider]  glimepiride (AMARYL) 1 MG tablet Take 1 mg by mouth daily.   Yes  [provider]  Glucose Blood (BLOOD GLUCOSE TEST STRIPS) STRP 1 strip by Other route 3 (three) times a week. 01/26/19  Yes [provider]  levothyroxine (SYNTHROID) 150 MCG tablet Take 150 mcg by mouth every evening.   Yes [provider]  lisinopril (ZESTRIL) 20 MG tablet Take 40 mg by mouth every evening. 09/14/19 09/13/20 Yes [provider]  meloxicam (MOBIC) 15 MG tablet Take 15 mg by mouth daily.   Yes [provider]  nystatin (MYCOSTATIN/NYSTOP) powder Apply 1 application topically 3 (three) times daily as needed for rash. Apply to groin area tid prn for yeast in groin area. 03/24/18  Yes [provider]  omeprazole (PRILOSEC) 40 MG capsule Take 40 mg by mouth daily.   Yes [provider]  triamcinolone cream (KENALOG) 0.1 % Apply 1 application topically daily as needed. 10/16/19  Yes [provider]    Allergies:   Allergies  Allergen Reactions  . Ciprofloxacin Hives  . Codeine     Don't like the way it makes me feel.  . Latex Itching, Rash and Other (See Comments)  . Povidone-Iodine Rash    Other reaction(s): Other (See Comments) unknown     Social History:  reports that she has never smoked. She has never used smokeless tobacco. She reports that she does not drink alcohol or use drugs.  Family History: Family History  Problem Relation Age of Onset  . Pneumonia Mother     Physical Exam: Blood pressure (!) 110/48, pulse 91, temperature 98.1 F (36.7 C), temperature source Oral, resp. rate 20, SpO2 94 %. General: Alert, awake, oriented x3, in no acute distress. Eyes: pink conjunctiva,anicteric sclera, pupils equal and reactive to light and accomodation, HEENT: normocephalic, atraumatic, oropharynx clear Neck: supple, no masses or lymphadenopathy, no goiter, no bruits, no JVD CVS: Regular rate and rhythm, without murmurs, rubs or gallops. No lower extremity edema Resp : Decreased breath sound at the  bases R>L GI : Soft, nontender, nondistended, positive bowel sounds, no masses. No hepatomegaly. No hernia.  Musculoskeletal: No clubbing or cyanosis, positive pedal pulses. No contracture. ROM intact  Neuro: Grossly intact, no focal neurological deficits, strength 5/5 upper and lower extremities bilaterally Psych: alert and oriented x 3, normal mood and affect Skin: Bilateral lower extremity lymphedema with chronic venous stasis changes   LABS on Admission: I have personally reviewed all the labs and imagings below    Basic Metabolic Panel: Recent Labs  Lab 11/27/19 1351  NA 141  K 4.3  CL 102  CO2 30  GLUCOSE 85  BUN 18  CREATININE 1.08*  CALCIUM 8.5*   Liver Function Tests: Recent Labs  Lab 11/27/19 1351  AST 36  ALT 18  ALKPHOS 217*  BILITOT 0.7  PROT 7.2  ALBUMIN 3.0*   No results for input(s): LIPASE, AMYLASE in the last 168 hours. No results for input(s): AMMONIA in the last 168 hours. CBC: Recent Labs  Lab 11/27/19 1351  WBC 12.9*  NEUTROABS 9.1*  HGB 12.4  HCT 40.0  MCV 96.4  PLT 185   Cardiac Enzymes: No results for input(s): CKTOTAL, CKMB, CKMBINDEX, TROPONINI in the last 168 hours. BNP: Invalid input(s): POCBNP CBG: No results for input(s): GLUCAP in the last 168 hours.  Radiological Exams on Admission:  DG Chest Port 1 View  Result Date: 11/27/2019 CLINICAL DATA:  Cough EXAM: PORTABLE CHEST 1 VIEW COMPARISON:  May 10, 2019 FINDINGS: There is ill-defined opacity in the medial right base. The lungs elsewhere are clear. Heart size and pulmonary vascularity are normal. No adenopathy. There are surgical clips in the thyroid region. IMPRESSION: Ill-defined opacity medial right base, likely due to developing pneumonia. Lungs elsewhere clear. Cardiac silhouette normal. Status post thyroidectomy. No adenopathy evident. Electronically Signed   By: Lowella Grip III M.D.   On: 11/27/2019 14:35      EKG: Independently reviewed.  Rate 86, normal  sinus rhythm, prolonged QTC 518   Assessment/Plan Principal Problem:   Acute respiratory failure (HCC) hypoxia with community-acquired pneumonia however high likelihood of Covid viral pneumonia -Patient presenting with productive cough, shortness of breath, hypoxia.  Multiple family members has Covid, husband also Covid positive -Elevated inflammatory markers including CRP and D-dimer.   -D-dimer 9.15, obtain CT angiogram of the chest to rule out PE.  Patient has peripheral edema, rule out a DVT.  For now placed on therapeutic Lovenox until PE/DVT ruled out -Appears to be somewhat dehydrated, gentle hydration for 1 L, then saline lock.  -Awaiting confirmatory test for COVID-19  -Patient started on Rocephin, Zithromax in ED, procalcitonin less than 0.1.  For now we will continue until confirmatory test for COVID-19 results back. -Continue airborne and contact precautions -If Covid 19+, will start patient on IV Decadron and remdesivir per pharmacy   Active Problems:   GERD (gastroesophageal reflux disease) -Continue PPI    Hypothyroidism -Continue Synthroid    Diabetes mellitus ([place]), type II, NIDDM -Hold Amaryl, placed on sliding scale insulin while inpatient -Obtain hemoglobin A1c    Essential hypertension -Will restart lisinopril, Coreg, amlodipine. -  Holding Lasix for now.  Generalized debility -PT OT evaluation while inpatient   DVT prophylaxis: Currently on full dose Lovenox until DVT/PE ruled out  CODE STATUS: Discussed with the patient, full CODE STATUS  Consults called: None  Family Communication: Admission, patients condition and plan of care including tests being ordered have been discussed with the patient and daughter, Neale Burly. who indicates understanding and agree with the plan and Code Status  Admission status:   The medical decision making on this patient was of high complexity and the patient is at high risk for clinical deterioration, therefore  this is a level 3 admission.  Severity of Illness:     The appropriate patient status for this patient is INPATIENT. Inpatient status is judged to be reasonable and necessary in order to provide the required intensity of service to ensure the patient's safety. The patient's presenting symptoms, physical exam findings, and initial radiographic and laboratory data in the context of their chronic comorbidities is felt to place them at high risk for further clinical deterioration. Furthermore, it is not anticipated that the patient will be medically stable for discharge from the hospital within 2 midnights of admission. The following factors support the patient status of inpatient.   " The patient's presenting symptoms include hypoxia, shortness of breath, pneumonia on chest x-ray, Covid test pending " The worrisome physical exam findings include hypoxia, mild dehydration " The initial radiographic and laboratory data are  worrisome because of chest x-ray with pneumonia " The chronic co-morbidities include hypertension, advanced age, family members with Covid   * I certify that at the point of admission it is my clinical judgment that the patient will require inpatient hospital care spanning beyond 2 midnights from the point of admission due to high intensity of service, high risk for further deterioration and high frequency of surveillance required.*    Time Spent on Admission: 70 minutes     Brandy Morris M.D. [facility] 11/27/2019, 5:29 PM

## 2019-11-27 NOTE — ED Triage Notes (Signed)
EMS reports from MD office, Shortness of breath x 3 weeks with productive cough . Son Dx with Covid a few weeks ago. Pt states she has stopped taking "water pill" for several days because she said it doesn't work.  BP 178/100 HR 74 RR 20 Sp02 97 3ltrs Temp 97.7

## 2019-11-27 NOTE — ED Provider Notes (Signed)
[facility]-EMERGENCY DEPT Provider Note   CSN: [id number] Arrival date & time: 11/27/19  1233     History Chief Complaint  Patient presents with  . Shortness of Breath  . Cough  . Covid Exposure    Brandy Morris is a 85 y.o. female.  85 year old female presents with shortness of breath and cough x3 weeks.  For members diagnosed with Covid recently as she was at her doctor's office today for similar symptoms.  Was found to have a pulse oximetry of 87% on room air.  She does not use oxygen chronically.  Patient sent here for further evaluation.  Patient herself denies any recent fever or chest pain.  Has chronic lower extremity edema.  No vomiting or diarrhea.  No treatment use prior to arrival        Past Medical History:  Diagnosis Date  . Diabetes mellitus without complication (HCC)   . GERD (gastroesophageal reflux disease)   . Hypertension     There are no problems to display for this patient.   Past Surgical History:  Procedure Laterality Date  . ABDOMINAL HYSTERECTOMY    . Bladder Tack    . CHOLECYSTECTOMY    . JOINT REPLACEMENT    . THYROIDECTOMY    . TUBAL LIGATION       OB History   No obstetric history on file.     Family History  Problem Relation Age of Onset  . Pneumonia Mother     Social History   Tobacco Use  . Smoking status: Never Smoker  . Smokeless tobacco: Never Used  Substance Use Topics  . Alcohol use: No  . Drug use: No    Home Medications Prior to Admission medications   Medication Sig Start Date End Date Taking? Authorizing Provider  amLODipine (NORVASC) 2.5 MG tablet  09/21/13   [provider]  glimepiride (AMARYL) 1 MG tablet Take 1 mg by mouth daily.    [provider]  hydrALAZINE (APRESOLINE) 25 MG tablet Take 25 mg by mouth 3 (three) times daily.    [provider]  levothyroxine (SYNTHROID, LEVOTHROID) 175 MCG tablet Take 175 mcg by mouth daily.    [provider]  lisinopril (PRINIVIL,ZESTRIL) 20 MG tablet  10/10/13   [provider]  meloxicam (MOBIC) 15 MG tablet Take 15 mg by mouth daily.    [provider]  metFORMIN (GLUCOPHAGE) 850 MG tablet  09/05/13   [provider]  metoprolol succinate (TOPROL-XL) 100 MG 24 hr tablet Take 100 mg by mouth daily. Take with or immediately following a meal.    [provider]  omeprazole (PRILOSEC) 40 MG capsule Take 40 mg by mouth daily.    [provider]    Allergies    Codeine and Latex  Review of Systems   Review of Systems  All other systems reviewed and are negative.   Physical Exam Updated Vital Signs BP (!) 168/78   Pulse 71   Temp 98.1 F (36.7 C) (Oral)   Resp 20   SpO2 98%   Physical Exam Vitals and nursing note reviewed.  Constitutional:      General: She is not in acute distress.    Appearance: Normal appearance. She is well-developed. She is not toxic-appearing.  HENT:     Head: Normocephalic and atraumatic.  Eyes:     General: Lids are normal.     Conjunctiva/sclera: Conjunctivae normal.     Pupils: Pupils are equal, round,  and reactive to light.  Neck:     Thyroid: No thyroid mass.     Trachea: No tracheal deviation.  Cardiovascular:     Rate and Rhythm: Normal rate and regular rhythm.     Heart sounds: Normal heart sounds. No murmur. No gallop.   Pulmonary:     Effort: Pulmonary effort is normal. No respiratory distress.     Breath sounds: Normal breath sounds. No stridor. No decreased breath sounds, wheezing, rhonchi or rales.  Abdominal:     General: Bowel sounds are normal. There is no distension.     Palpations: Abdomen is soft.     Tenderness: There is no abdominal tenderness. There is no rebound.  Musculoskeletal:        General: No tenderness. Normal range of motion.     Cervical back: Normal range of motion and neck supple.  Lymphadenopathy:     Comments: 3+ bilateral lower extremity pitting edema   Skin:    General: Skin is warm and dry.     Findings: No abrasion or rash.  Neurological:     Mental Status: She is alert and oriented to person, place, and time.     GCS: GCS eye subscore is 4. GCS verbal subscore is 5. GCS motor subscore is 6.     Cranial Nerves: No cranial nerve deficit.     Sensory: No sensory deficit.  Psychiatric:        Speech: Speech normal.        Behavior: Behavior normal.     ED Results / Procedures / Treatments   Labs (all labs ordered are listed, but only abnormal results are displayed) Labs Reviewed  CULTURE, BLOOD (ROUTINE X 2)  CULTURE, BLOOD (ROUTINE X 2)  LACTIC ACID, PLASMA  LACTIC ACID, PLASMA  CBC WITH DIFFERENTIAL/PLATELET  COMPREHENSIVE METABOLIC PANEL  D-DIMER, QUANTITATIVE (NOT AT [facility])  PROCALCITONIN  LACTATE DEHYDROGENASE  FERRITIN  TRIGLYCERIDES  FIBRINOGEN  C-REACTIVE PROTEIN  POC SARS CORONAVIRUS 2 AG -  ED    EKG EKG Interpretation  Date/Time:  Tuesday November 27 2019 13:33:24 EST Ventricular Rate:  86 PR Interval:    QRS Duration: 101 QT Interval:  433 QTC Calculation: 518 R Axis:   89 Text Interpretation: Sinus rhythm Probable inferior infarct, old Prolonged QT interval Confirmed by Lacretia Leigh ([id number]) on 11/27/2019 3:40:44 PM   Radiology No results found.  Procedures Procedures (including critical care time)  Medications Ordered in ED Medications  0.9 %  sodium chloride infusion (has no administration in time range)    ED Course  I have reviewed the triage vital signs and the nursing notes.  Pertinent labs & imaging results that were available during my care of the patient were reviewed by me and considered in my medical decision making (see chart for details).    MDM Rules/Calculators/A&P                      Patient's chest x-ray consistent with pneumonia.  Rapid bedside Covid test negative.  PCR Covid test sent as well.  Patient was taken off oxygen and desatted to 87%.  Placed back on 3 L and  sats are above 92% at this time.  Started on IV antibiotics and will require inpatient admission Final Clinical Impression(s) / ED Diagnoses Final diagnoses:  None    Rx / DC Orders ED Discharge Orders    None       Lacretia Leigh, MD 11/27/19 1541

## 2019-11-27 NOTE — ED Notes (Signed)
Patient transported to CT 

## 2019-11-28 ENCOUNTER — Inpatient Hospital Stay (HOSPITAL_COMMUNITY): Payer: Medicare HMO

## 2019-11-28 DIAGNOSIS — R7989 Other specified abnormal findings of blood chemistry: Secondary | ICD-10-CM

## 2019-11-28 DIAGNOSIS — U071 COVID-19: Principal | ICD-10-CM

## 2019-11-28 DIAGNOSIS — L8990 Pressure ulcer of unspecified site, unspecified stage: Secondary | ICD-10-CM | POA: Insufficient documentation

## 2019-11-28 DIAGNOSIS — J1282 Pneumonia due to coronavirus disease 2019: Secondary | ICD-10-CM

## 2019-11-28 LAB — CBC WITH DIFFERENTIAL/PLATELET
Abs Immature Granulocytes: 0.09 10*3/uL — ABNORMAL HIGH (ref 0.00–0.07)
Basophils Absolute: 0 10*3/uL (ref 0.0–0.1)
Basophils Relative: 0 %
Eosinophils Absolute: 0 10*3/uL (ref 0.0–0.5)
Eosinophils Relative: 0 %
HCT: 40 % (ref 36.0–46.0)
Hemoglobin: 12.4 g/dL (ref 12.0–15.0)
Immature Granulocytes: 1 %
Lymphocytes Relative: 14 %
Lymphs Abs: 1.2 10*3/uL (ref 0.7–4.0)
MCH: 29.5 pg (ref 26.0–34.0)
MCHC: 31 g/dL (ref 30.0–36.0)
MCV: 95.2 fL (ref 80.0–100.0)
Monocytes Absolute: 0.1 10*3/uL (ref 0.1–1.0)
Monocytes Relative: 1 %
Neutro Abs: 7.1 10*3/uL (ref 1.7–7.7)
Neutrophils Relative %: 84 %
Platelets: 190 10*3/uL (ref 150–400)
RBC: 4.2 MIL/uL (ref 3.87–5.11)
RDW: 14.6 % (ref 11.5–15.5)
WBC: 8.5 10*3/uL (ref 4.0–10.5)
nRBC: 0 % (ref 0.0–0.2)

## 2019-11-28 LAB — COMPREHENSIVE METABOLIC PANEL
ALT: 18 U/L (ref 0–44)
AST: 34 U/L (ref 15–41)
Albumin: 3 g/dL — ABNORMAL LOW (ref 3.5–5.0)
Alkaline Phosphatase: 217 U/L — ABNORMAL HIGH (ref 38–126)
Anion gap: 11 (ref 5–15)
BUN: 18 mg/dL (ref 8–23)
CO2: 29 mmol/L (ref 22–32)
Calcium: 8.5 mg/dL — ABNORMAL LOW (ref 8.9–10.3)
Chloride: 99 mmol/L (ref 98–111)
Creatinine, Ser: 1.12 mg/dL — ABNORMAL HIGH (ref 0.44–1.00)
GFR calc Af Amer: 53 mL/min — ABNORMAL LOW (ref >60)
GFR calc non Af Amer: 46 mL/min — ABNORMAL LOW (ref >60)
Glucose, Bld: 170 mg/dL — ABNORMAL HIGH (ref 70–99)
Potassium: 4.9 mmol/L (ref 3.5–5.1)
Sodium: 139 mmol/L (ref 135–145)
Total Bilirubin: 0.7 mg/dL (ref 0.3–1.2)
Total Protein: 7 g/dL (ref 6.5–8.1)

## 2019-11-28 LAB — CBG MONITORING, ED
Glucose-Capillary: 156 mg/dL — ABNORMAL HIGH (ref 70–99)
Glucose-Capillary: 241 mg/dL — ABNORMAL HIGH (ref 70–99)
Glucose-Capillary: 166 mg/dL — ABNORMAL HIGH (ref 70–99)

## 2019-11-28 LAB — ABO/RH: ABO/RH(D): AB POS

## 2019-11-28 LAB — HEMOGLOBIN A1C
Hgb A1c MFr Bld: 6.4 % — ABNORMAL HIGH (ref 4.8–5.6)
Mean Plasma Glucose: 136.98 mg/dL

## 2019-11-28 LAB — GLUCOSE, CAPILLARY: Glucose-Capillary: 191 mg/dL — ABNORMAL HIGH (ref 70–99)

## 2019-11-28 LAB — HIV ANTIBODY (ROUTINE TESTING W REFLEX): HIV Screen 4th Generation wRfx: NONREACTIVE

## 2019-11-28 LAB — C-REACTIVE PROTEIN: CRP: 7.8 mg/dL — ABNORMAL HIGH (ref <1.0)

## 2019-11-28 LAB — D-DIMER, QUANTITATIVE: D-Dimer, Quant: 11.8 ug/mL-FEU — ABNORMAL HIGH (ref 0.00–0.50)

## 2019-11-28 LAB — MAGNESIUM: Magnesium: 2.2 mg/dL (ref 1.7–2.4)

## 2019-11-28 LAB — PHOSPHORUS: Phosphorus: 2.8 mg/dL (ref 2.5–4.6)

## 2019-11-28 LAB — FERRITIN: Ferritin: 42 ng/mL (ref 11–307)

## 2019-11-28 MED ORDER — ENOXAPARIN SODIUM 60 MG/0.6ML ~~LOC~~ SOLN
60.0000 mg | Freq: Two times a day (BID) | SUBCUTANEOUS | Status: DC
  Administered 2019-11-28 – 2019-12-01 (×7): 60 mg via SUBCUTANEOUS
  Filled 2019-11-28 (×8): qty 0.6

## 2019-11-28 MED ORDER — HYDROXYZINE HCL 25 MG PO TABS: 25.0000 mg | ORAL_TABLET | Freq: Three times a day (TID) | ORAL | Status: DC | PRN

## 2019-11-28 MED ORDER — GUAIFENESIN ER 600 MG PO TB12
600.0000 mg | ORAL_TABLET | Freq: Two times a day (BID) | ORAL | Status: DC
  Administered 2019-11-28 – 2019-12-01 (×7): 600 mg via ORAL
  Filled 2019-11-28 (×8): qty 1

## 2019-11-28 NOTE — Progress Notes (Signed)
PT Cancellation Note  Patient Details Name: Brandy Morris MRN: [id number] DOB: Sep 27, 1935   Cancelled Treatment:    Reason Eval/Treat Not Completed: Other (comment)(dopplers to r/o DVT pending. Will follow.)   Tamala Ser PT 11/28/2019  Acute Rehabilitation Services Pager [phone] Office [phone]

## 2019-11-28 NOTE — Evaluation (Addendum)
Physical Therapy Evaluation Patient Details Name: Brandy Morris MRN: [id number] DOB: 05/08/1935 Today's Date: 11/28/2019   History of Present Illness  85 year old female with history of diabetes mellitus, hypertension, GERD, lower extremity lymphedema presented from her doctor's office for shortness of breath with productive cough.  Her O2 sats were found to be 87% on room air. Pt has family members who are covid +.  Dx of covid-19, PNA.  Clinical Impression  Pt admitted with above diagnosis. Min assist for bed mobility and transfers, pt ambulated 12' with RW in room, distance limited by fatigue. HR 121 and SaO2 87% on room air walking, 94% on 2L O2 [place] at rest. Instructed pt in pursed lip breathing and in UE/LE strengthening exercises to be done independently.  Pt reports she has no assistance available at home as her husband is hospitalized with covid and her children all have covid. She stated she's not willing to go to a SNF. Recommending [facility] home health services.  Pt currently with functional limitations due to the deficits listed below (see PT Problem List). Pt will benefit from skilled PT to increase their independence and safety with mobility to allow discharge to the venue listed below.       Follow Up Recommendations SNF;Home health PT;Supervision for mobility/OOB(HHPT if pt declines SNF; pt stated her spouse (he is in ED) and children all have covid and cannot provide assistance)    Equipment Recommendations  Wheelchair (measurements PT);Wheelchair cushion (measurements PT)    Recommendations for Other Services       Precautions / Restrictions Precautions Precautions: Other (comment) Precaution Comments: monitor O2 Restrictions Weight Bearing Restrictions: No      Mobility  Bed Mobility Overal bed mobility: Needs Assistance Bed Mobility: Supine to Sit     Supine to sit: HOB elevated;Min assist     General bed mobility comments: min A to raise  trunk  Transfers Overall transfer level: Needs assistance Equipment used: Rolling walker (2 wheeled) Transfers: Sit to/from Stand Sit to Stand: Min assist;From elevated surface         General transfer comment: min A to rise  Ambulation/Gait Ambulation/Gait assistance: Min guard Gait Distance (Feet): 12 Feet Assistive device: Rolling walker (2 wheeled) Gait Pattern/deviations: Step-through pattern;Decreased stride length Gait velocity: decr   General Gait Details: 6' forward, 6' backwards with RW; SaO2 87% on room air walking, HR 121 walking, 1/4 dyspnea, no loss of balance, distance limited by fatigue  Stairs            Wheelchair Mobility    Modified Rankin (Stroke Patients Only)       Balance Overall balance assessment: Needs assistance Sitting-balance support: Feet supported;No upper extremity supported Sitting balance-Leahy Scale: Fair     Standing balance support: Single extremity supported Standing balance-Leahy Scale: Poor Standing balance comment: relies on single UE support                             Pertinent Vitals/Pain Pain Assessment: No/denies pain    Home Living Family/patient expects to be discharged to:: Private residence Living Arrangements: Spouse/significant other   Type of Home: House Home Access: Ramped entrance     Home Layout: One level Home Equipment: Bedside commode;Other (comment)(3 wheeled RW)      Prior Function Level of Independence: Independent with assistive device(s)         Comments: walks with 3 wheeled RW, sits on BSC to shower  Hand Dominance        Extremity/Trunk Assessment   Upper Extremity Assessment Upper Extremity Assessment: RUE deficits/detail RUE Deficits / Details: shoulder elevation ~35* AROM ("this shoulder is shot")    Lower Extremity Assessment Lower Extremity Assessment: Generalized weakness(B knee ext -4/5)    Cervical / Trunk Assessment Cervical / Trunk  Assessment: Normal  Communication   Communication: No difficulties  Cognition Arousal/Alertness: Awake/alert Behavior During Therapy: WFL for tasks assessed/performed Overall Cognitive Status: Within Functional Limits for tasks assessed                                        General Comments      Exercises General Exercises - Lower Extremity Ankle Circles/Pumps: AROM;Both;10 reps;Seated Long Arc Quad: AROM;Both;10 reps;Seated Shoulder Exercises Shoulder Flexion: AROM;Left;5 reps;Seated   Assessment/Plan    PT Assessment Patient needs continued PT services  PT Problem List Decreased strength;Decreased activity tolerance;Decreased mobility;Cardiopulmonary status limiting activity       PT Treatment Interventions Gait training;Functional mobility training;Therapeutic activities;Therapeutic exercise;Patient/family education    PT Goals (Current goals can be found in the Care Plan section)  Acute Rehab PT Goals Patient Stated Goal: to go home PT Goal Formulation: With patient Time For Goal Achievement: 12/12/19 Potential to Achieve Goals: Good    Frequency Min 3X/week   Barriers to discharge        Co-evaluation               AM-PAC PT "6 Clicks" Mobility  Outcome Measure Help needed turning from your back to your side while in a flat bed without using bedrails?: A Little Help needed moving from lying on your back to sitting on the side of a flat bed without using bedrails?: A Little Help needed moving to and from a bed to a chair (including a wheelchair)?: A Little Help needed standing up from a chair using your arms (e.g., wheelchair or bedside chair)?: A Little Help needed to walk in hospital room?: A Little Help needed climbing 3-5 steps with a railing? : A Lot 6 Click Score: 17    End of Session Equipment Utilized During Treatment: Gait belt;Oxygen Activity Tolerance: Patient limited by fatigue Patient left: in bed;with call bell/phone  within reach Nurse Communication: Mobility status PT Visit Diagnosis: Difficulty in walking, not elsewhere classified (R26.2);Muscle weakness (generalized) (M62.81)    Time: 3267-1245 PT Time Calculation (min) (ACUTE ONLY): 37 min   Charges:   PT Evaluation $PT Eval Moderate Complexity: 1 Mod PT Treatments $Gait Training: 8-22 mins       Ralene Bathe Kistler PT 11/28/2019  Acute Rehabilitation Services Pager [phone] Office [phone]

## 2019-11-28 NOTE — Progress Notes (Signed)
PROGRESS NOTE  Brandy Morris:[id number] DOB: 1935-11-06 DOA: 11/27/2019 PCP: Brandy Morris., MD  Brief History:  85 year old female with history of diabetes mellitus, hypertension, GERD, lower extremity lymphedema presented from her doctor's office for shortness of breath with productive cough.  Her O2 sats were found to be 87% on room air, not on O2 at home.  Per patient, she has exposure to the Covid, daughter has Covid and other family members as well.  She had gone to wound care for her lower extremity lymphedema with her daughter about 3 weeks ago.  In the last 2 weeks, patient has been feeling shortness of breath, progressively worsening with coughing.  She did have subjective fevers and chills but no chest pain, no nausea vomiting or diarrhea. SARS-CoV2 RT PCR is positive.  She was started on remdesivir and dexamethasone  Assessment/Plan: Acute respiratory failure with hypoxia secondary to COVID-19 pneumonia -Presently stable on 2 L nasal cannula -Continue dexamethasone and remdesivir -CRP 9.2>>> 7.8 -Ferritin 43>>> 42 -D-dimer 11.8 -Procalcitonin <0.10 -CTA chest negative for PE; multifocal pneumonia -Continue to cycle inflammatory markers -Discontinue ceftriaxone and azithromycin    GERD (gastroesophageal reflux disease) -Continue PPI    Hypothyroidism -Continue Synthroid    Diabetes mellitus (HCC), type II, NIDDM -Hold Amaryl, placed on sliding scale insulin while inpatient -Obtain hemoglobin A1c    Essential hypertension -restart  Coreg, amlodipine. -  Holding Lasix for now. -hold lisinopril for now Morris to up trending K  Generalized debility -PT OT evaluation while inpatient       Disposition Plan:   Home in 2-3 days  Family Communication:  No Family at bedside  Consultants:  none  Code Status:  FULL  DVT Prophylaxis:  [place] Lovenox   Procedures: As Listed in Progress Note Above  Antibiotics: None       Subjective: Pt is  breathing better but remains sob.  Patient denies fevers, chills, headache, chest pain, dyspnea, nausea, vomiting, diarrhea, abdominal pain, dysuria   Objective: Vitals:   11/27/19 1700 11/27/19 1730 11/28/19 0013 11/28/19 0644  BP: (!) 110/48 (!) 157/105 (!) 164/65 (!) 171/79  Pulse: 91 71 70 71  Resp: 20 15 18 18   Temp:   98 F (36.7 C) 98.1 F (36.7 C)  TempSrc:   Oral Oral  SpO2: 94% 95% 97% 97%    Intake/Output Summary (Last 24 hours) at 11/28/2019 01/26/2020 Last data filed at 11/27/2019 1740 Gross per 24 hour  Intake 350 ml  Output --  Net 350 ml   Weight change:  Exam:   General:  Pt is alert, follows commands appropriately, not in acute distress  HEENT: No icterus, No thrush, No neck mass, [place]/AT  Cardiovascular: RRR, S1/S2, no rubs, no gallops  Respiratory: bilateral rales. No wheeze  Abdomen: Soft/+BS, non tender, non distended, no guarding  Extremities: 2+ LE edema, +lichenification of legs bilateral No petechiae, No rashes, no synovitis   Data Reviewed: I have personally reviewed following labs and imaging studies Basic Metabolic Panel: Recent Labs  Lab 11/27/19 1351 11/28/19 0551  NA 141 139  K 4.3 4.9  CL 102 99  CO2 30 29  GLUCOSE 85 170*  BUN 18 18  CREATININE 1.08* 1.12*  CALCIUM 8.5* 8.5*  MG  --  2.2  PHOS  --  2.8   Liver Function Tests: Recent Labs  Lab 11/27/19 1351 11/28/19 0551  AST 36 34  ALT 18 18  ALKPHOS  217* 217*  BILITOT 0.7 0.7  PROT 7.2 7.0  ALBUMIN 3.0* 3.0*   No results for input(s): LIPASE, AMYLASE in the last 168 hours. No results for input(s): AMMONIA in the last 168 hours. Coagulation Profile: No results for input(s): INR, PROTIME in the last 168 hours. CBC: Recent Labs  Lab 11/27/19 1351 11/28/19 0551  WBC 12.9* 8.5  NEUTROABS 9.1* 7.1  HGB 12.4 12.4  HCT 40.0 40.0  MCV 96.4 95.2  PLT 185 190   Cardiac Enzymes: No results for input(s): CKTOTAL, CKMB, CKMBINDEX, TROPONINI in the last 168  hours. BNP: Invalid input(s): POCBNP CBG: Recent Labs  Lab 11/27/19 2336 11/28/19 0809  GLUCAP 111* 156*   HbA1C: Recent Labs    11/27/19 2338  HGBA1C 6.4*   Urine analysis:    Component Value Date/Time   COLORURINE YELLOW 06/11/2008 1105   APPEARANCEUR CLEAR 06/11/2008 1105   LABSPEC 1.015 06/11/2008 1105   PHURINE 6.5 06/11/2008 1105   GLUCOSEU NEGATIVE 06/11/2008 1105   HGBUR NEGATIVE 06/11/2008 1105   BILIRUBINUR NEGATIVE 06/11/2008 1105   KETONESUR NEGATIVE 06/11/2008 1105   PROTEINUR NEGATIVE 06/11/2008 1105   UROBILINOGEN 0.2 06/11/2008 1105   NITRITE NEGATIVE 06/11/2008 1105   LEUKOCYTESUR  06/11/2008 1105    NEGATIVE MICROSCOPIC NOT DONE ON URINES WITH NEGATIVE PROTEIN, BLOOD, LEUKOCYTES, NITRITE, OR GLUCOSE <1000 mg/dL.   Sepsis Labs: @LABRCNTIP (procalcitonin:4,lacticidven:4) ) Recent Results (from the past 240 hour(s))  SARS CORONAVIRUS 2 ([name] 6-24 HRS) Nasopharyngeal Nasopharyngeal Swab     Status: Abnormal   Collection Time: 11/27/19  2:47 PM   Specimen: Nasopharyngeal Swab  Result Value Ref Range Status   SARS Coronavirus 2 POSITIVE (A) NEGATIVE Final    Comment: RESULT CALLED TO, READ BACK BY AND VERIFIED WITH: Q MILNER,RN 2236 11/27/2019 D BRADLEY (NOTE) SARS-CoV-2 target nucleic acids are DETECTED. The SARS-CoV-2 RNA is generally detectable in upper and lower respiratory specimens during the acute phase of infection. Positive results are indicative of the presence of SARS-CoV-2 RNA. Clinical correlation with patient history and other diagnostic information is  necessary to determine patient infection status. Positive results do not rule out bacterial infection or co-infection with other viruses.  The expected result is Negative. Fact Sheet for Patients: SugarRoll.be Fact Sheet for Healthcare Providers: https://www.woods-mathews.com/ This test is not yet approved or cleared by the [place] FDA and   has been authorized for detection and/or diagnosis of SARS-CoV-2 by FDA under an Emergency Use Authorization (EUA). This EUA will remain  in effect (meaning this test can be used) for the  duration of the COVID-19 declaration under Section 564(b)(1) of the Act, 21 U.S.C. section 360bbb-3(b)(1), unless the authorization is terminated or revoked sooner. Performed at [facility], [place] [address], [place], [place] [address]      Scheduled Meds: . amLODipine  5 mg Oral Daily  . vitamin C  500 mg Oral Daily  . carvedilol  12.5 mg Oral QHS  . dexamethasone (DECADRON) injection  6 mg Intravenous Daily  . ferrous sulfate  325 mg Oral BID  . insulin aspart  0-5 Units Subcutaneous QHS  . insulin aspart  0-9 Units Subcutaneous TID WC  . levothyroxine  150 mcg Oral QPM  . lisinopril  40 mg Oral QPM  . pantoprazole  40 mg Oral Daily  . sodium chloride (PF)      . zinc sulfate  220 mg Oral Daily   Continuous Infusions: . sodium chloride Stopped (11/28/19 0140)  . azithromycin Stopped (  11/27/19 1740)  . cefTRIAXone (ROCEPHIN)  IV Stopped (11/27/19 1640)  . remdesivir 100 mg in NS 100 mL      Procedures/Studies: CT ANGIO CHEST PE W OR WO CONTRAST  Result Date: 11/27/2019 CLINICAL DATA:  85 year old female with elevated D-dimer and shortness of breath. EXAM: CT ANGIOGRAPHY CHEST WITH CONTRAST TECHNIQUE: Multidetector CT imaging of the chest was performed using the standard protocol during bolus administration of intravenous contrast. Multiplanar CT image reconstructions and MIPs were obtained to evaluate the vascular anatomy. CONTRAST:  OMNIPAQUE IOHEXOL 350 MG/ML SOLN COMPARISON:  Chest radiograph dated 11/27/2019 and CT dated 11/04/2008. FINDINGS: Evaluation is limited Morris to streak artifact caused by patient's arms. Cardiovascular: There is mild cardiomegaly. No pericardial effusion. Coronary vascular calcifications noted. There is mild atherosclerotic calcification of the thoracic  aorta. Mild dilatation of the main pulmonary trunk suggestive of a degree of pulmonary hypertension. Clinical correlation is recommended. Evaluation of the pulmonary arteries is very limited Morris to suboptimal opacification and timing of the contrast as well as severe respiratory motion artifact. No large or central pulmonary artery embolus identified. Mediastinum/Nodes: No definite hilar or mediastinal adenopathy. Small hiatal hernia. No mediastinal fluid collection. Lungs/Pleura: Bilateral streaky airspace densities most consistent with multifocal pneumonia, likely viral or atypical in etiology. Clinical correlation is recommended. No pleural effusion or pneumothorax. The central airways are patent. Upper Abdomen: Thickened appearance of the right adrenal gland, new since the prior CT. There is a 12 mm nodular lesion in the lateral limb of the left adrenal gland similar to prior CT. Evaluation of the upper abdomen is very limited on this CT Morris to respiratory motion artifact. Cholecystectomy. Musculoskeletal: Stable appearing right paraspinal 1.5 x 2.0 cm nodular area (series 5, image 143). Degenerative changes of the spine. No acute osseous pathology. Review of the MIP images confirms the above findings. IMPRESSION: 1. No CT evidence of central pulmonary artery embolus. 2. Multifocal pneumonia, likely viral or atypical in etiology. Clinical correlation and follow-up to resolution recommended. 3. Mild cardiomegaly with coronary vascular calcifications. 4. Aortic Atherosclerosis (ICD10-I70.0). Electronically Signed   By: Elgie Collard M.D.   On: 11/27/2019 22:17   DG Chest Port 1 View  Result Date: 11/27/2019 CLINICAL DATA:  Cough EXAM: PORTABLE CHEST 1 VIEW COMPARISON:  May 10, 2019 FINDINGS: There is ill-defined opacity in the medial right base. The lungs elsewhere are clear. Heart size and pulmonary vascularity are normal. No adenopathy. There are surgical clips in the thyroid region. IMPRESSION:  Ill-defined opacity medial right base, likely Morris to developing pneumonia. Lungs elsewhere clear. Cardiac silhouette normal. Status post thyroidectomy. No adenopathy evident. Electronically Signed   By: Bretta Bang III M.D.   On: 11/27/2019 14:35    Catarina Hartshorn, DO  [facility] Pager [phone]  If 7PM-7AM, please contact night-coverage [email or web address] Password [email or web address] 11/28/2019, 8:21 AM   LOS: 1 day

## 2019-11-28 NOTE — Progress Notes (Signed)
Lower extremity venous has been completed.   Preliminary results in CV Proc.   Blanch Media 11/28/2019 12:49 PM

## 2019-11-29 LAB — CBC WITH DIFFERENTIAL/PLATELET
Abs Immature Granulocytes: 0.1 10*3/uL — ABNORMAL HIGH (ref 0.00–0.07)
Basophils Absolute: 0 10*3/uL (ref 0.0–0.1)
Basophils Relative: 0 %
Eosinophils Absolute: 0 10*3/uL (ref 0.0–0.5)
Eosinophils Relative: 0 %
HCT: 37.1 % (ref 36.0–46.0)
Hemoglobin: 11.6 g/dL — ABNORMAL LOW (ref 12.0–15.0)
Immature Granulocytes: 1 %
Lymphocytes Relative: 17 %
Lymphs Abs: 1.9 10*3/uL (ref 0.7–4.0)
MCH: 29.4 pg (ref 26.0–34.0)
MCHC: 31.3 g/dL (ref 30.0–36.0)
MCV: 94.2 fL (ref 80.0–100.0)
Monocytes Absolute: 0.6 10*3/uL (ref 0.1–1.0)
Monocytes Relative: 5 %
Neutro Abs: 8.5 10*3/uL — ABNORMAL HIGH (ref 1.7–7.7)
Neutrophils Relative %: 77 %
Platelets: 209 10*3/uL (ref 150–400)
RBC: 3.94 MIL/uL (ref 3.87–5.11)
RDW: 14.4 % (ref 11.5–15.5)
WBC: 11.1 10*3/uL — ABNORMAL HIGH (ref 4.0–10.5)
nRBC: 0 % (ref 0.0–0.2)

## 2019-11-29 LAB — GLUCOSE, CAPILLARY
Glucose-Capillary: 120 mg/dL — ABNORMAL HIGH (ref 70–99)
Glucose-Capillary: 142 mg/dL — ABNORMAL HIGH (ref 70–99)
Glucose-Capillary: 147 mg/dL — ABNORMAL HIGH (ref 70–99)
Glucose-Capillary: 187 mg/dL — ABNORMAL HIGH (ref 70–99)

## 2019-11-29 LAB — COMPREHENSIVE METABOLIC PANEL
ALT: 15 U/L (ref 0–44)
AST: 26 U/L (ref 15–41)
Albumin: 2.7 g/dL — ABNORMAL LOW (ref 3.5–5.0)
Alkaline Phosphatase: 182 U/L — ABNORMAL HIGH (ref 38–126)
Anion gap: 10 (ref 5–15)
BUN: 23 mg/dL (ref 8–23)
CO2: 27 mmol/L (ref 22–32)
Calcium: 8.3 mg/dL — ABNORMAL LOW (ref 8.9–10.3)
Chloride: 100 mmol/L (ref 98–111)
Creatinine, Ser: 1.2 mg/dL — ABNORMAL HIGH (ref 0.44–1.00)
GFR calc Af Amer: 49 mL/min — ABNORMAL LOW (ref >60)
GFR calc non Af Amer: 42 mL/min — ABNORMAL LOW (ref >60)
Glucose, Bld: 154 mg/dL — ABNORMAL HIGH (ref 70–99)
Potassium: 4.6 mmol/L (ref 3.5–5.1)
Sodium: 137 mmol/L (ref 135–145)
Total Bilirubin: 0.6 mg/dL (ref 0.3–1.2)
Total Protein: 6.5 g/dL (ref 6.5–8.1)

## 2019-11-29 LAB — D-DIMER, QUANTITATIVE: D-Dimer, Quant: 6.58 ug/mL-FEU — ABNORMAL HIGH (ref 0.00–0.50)

## 2019-11-29 LAB — MAGNESIUM: Magnesium: 2.3 mg/dL (ref 1.7–2.4)

## 2019-11-29 LAB — FERRITIN: Ferritin: 42 ng/mL (ref 11–307)

## 2019-11-29 LAB — PHOSPHORUS: Phosphorus: 2.6 mg/dL (ref 2.5–4.6)

## 2019-11-29 LAB — C-REACTIVE PROTEIN: CRP: 4.9 mg/dL — ABNORMAL HIGH (ref <1.0)

## 2019-11-29 MED ORDER — CARVEDILOL 6.25 MG PO TABS
6.2500 mg | ORAL_TABLET | Freq: Every day | ORAL | Status: DC
  Administered 2019-11-29 – 2019-11-30 (×2): 6.25 mg via ORAL
  Filled 2019-11-29 (×2): qty 1

## 2019-11-29 MED ORDER — HYDRALAZINE HCL 25 MG PO TABS: 25.0000 mg | ORAL_TABLET | Freq: Three times a day (TID) | ORAL | Status: DC | PRN

## 2019-11-29 MED ORDER — ADULT MULTIVITAMIN W/MINERALS CH
1.0000 | ORAL_TABLET | Freq: Every day | ORAL | Status: DC
  Administered 2019-11-29 – 2019-12-01 (×3): 1 via ORAL
  Filled 2019-11-29 (×3): qty 1

## 2019-11-29 MED ORDER — PRO-STAT SUGAR FREE PO LIQD
30.0000 mL | Freq: Three times a day (TID) | ORAL | Status: DC
  Administered 2019-11-29 – 2019-12-01 (×7): 30 mL via ORAL
  Filled 2019-11-29 (×7): qty 30

## 2019-11-29 NOTE — TOC Progression Note (Signed)
Transition of Care [facility]) - Progression Note    Patient Details  Name: Brandy Morris MRN: [id number] Date of Birth: 1935-01-29  [facility] CM/SW Contact  Geni Bers, RN Phone Number: 11/29/2019, 4:42 PM  Clinical Narrative:     Spoke with pt who is adamant about going home with [facility]. [facility]. Referral given to In house rep.       Expected Discharge Plan and Services                                                 Social Determinants of Health (SDOH) Interventions    Readmission Risk Interventions No flowsheet data found.

## 2019-11-29 NOTE — Progress Notes (Addendum)
PROGRESS NOTE  Brandy Morris XNA:[id number] DOB: 1935/10/04 DOA: 11/27/2019 PCP: [name], MD  Brief History:  85 year old female with history of diabetes mellitus, hypertension, GERD, lower extremity lymphedema presented from her doctor's office for shortness of breath with productive cough.  Her O2 sats were found to be 87% on room air, not on O2 at home.  Per patient, she has exposure to the Covid, daughter has Covid and other family members as well.  She had gone to wound care for her lower extremity lymphedema with her daughter about 3 weeks ago.  In the last 2 weeks, patient has been feeling shortness of breath, progressively worsening with coughing.  She did have subjective fevers and chills but no chest pain, no nausea vomiting or diarrhea. SARS-CoV2 RT PCR is positive.  She was started on remdesivir and dexamethasone.  Assessment/Plan: Acute respiratory failure with hypoxia secondary to COVID-19 pneumonia -Presently stable on 2 L nasal cannula -Continue dexamethasone and remdesivir -CRP 9.2>>> 7.8 -Ferritin 43>>> 42 -D-dimer 11.8 -Procalcitonin <0.10 -CTA chest negative for PE; multifocal pneumonia -Continue to cycle inflammatory markers -Discontinue ceftriaxone and azithromycin  GERD (gastroesophageal reflux disease) -Continue PPI  Hypothyroidism -Continue Synthroid  Diabetes mellitus (HCC), type II, NIDDM -Hold Amaryl, placed on sliding scale insulin while inpatient -Obtain hemoglobin A1c  Essential hypertension -restart  Coreg, amlodipine. -Holding Lasix for now. -hold lisinopril for now due to up trending K  Generalized debility -PT OT evaluation while inpatient  Morbid obesity Body mass index is 50.24 kg/m.  Patient will benefit from outpatient dietary consultation.  Disposition Plan:   Home in 2-3 days  Family Communication:  No Family at bedside  Consultants:  none  Code Status:  FULL  DVT Prophylaxis:  [place]  Lovenox  Procedures: As Listed in Progress Note Above  Antibiotics: None   Subjective: Breathing better, no nausea no vomiting. No fever no chest pain.   Objective: Vitals:   11/28/19 2041 11/29/19 0444 11/29/19 0608 11/29/19 1331  BP: (!) 154/77 (!) 152/51  (!) 178/79  Pulse: 63 (!) 47 (!) 54 63  Resp: 18 18  20   Temp: 98.1 F (36.7 C) 98.1 F (36.7 C)  98.5 F (36.9 C)  TempSrc:    Oral  SpO2: 96% 95%  96%  Weight:      Height:        Intake/Output Summary (Last 24 hours) at 11/29/2019 1734 Last data filed at 11/29/2019 0600 Gross per 24 hour  Intake 0 ml  Output 400 ml  Net -400 ml   Weight change:  Exam:   General:  Pt is alert, follows commands appropriately, not in acute distress  HEENT: No icterus, No thrush, No neck mass, [place]/AT  Cardiovascular: RRR, S1/S2, no rubs, no gallops  Respiratory: bilateral rales. No wheeze  Abdomen: Soft/+BS, non tender, non distended, no guarding  Extremities: 2+ LE edema, +lichenification of legs bilateral No petechiae, No rashes, no synovitis   Data Reviewed: I have personally reviewed following labs and imaging studies Basic Metabolic Panel: Recent Labs  Lab 11/27/19 1351 11/28/19 0551 11/29/19 0250  NA 141 139 137  K 4.3 4.9 4.6  CL 102 99 100  CO2 30 29 27   GLUCOSE 85 170* 154*  BUN 18 18 23   CREATININE 1.08* 1.12* 1.20*  CALCIUM 8.5* 8.5* 8.3*  MG  --  2.2 2.3  PHOS  --  2.8 2.6   Liver Function Tests: Recent Labs  Lab 11/27/19 1351 11/28/19 0551 11/29/19 0250  AST 36 34 26  ALT 18 18 15   ALKPHOS 217* 217* 182*  BILITOT 0.7 0.7 0.6  PROT 7.2 7.0 6.5  ALBUMIN 3.0* 3.0* 2.7*   No results for input(s): LIPASE, AMYLASE in the last 168 hours. No results for input(s): AMMONIA in the last 168 hours. Coagulation Profile: No results for input(s): INR, PROTIME in the last 168 hours. CBC: Recent Labs  Lab 11/27/19 1351 11/28/19 0551 11/29/19 0250  WBC 12.9* 8.5 11.1*  NEUTROABS 9.1* 7.1 8.5*   HGB 12.4 12.4 11.6*  HCT 40.0 40.0 37.1  MCV 96.4 95.2 94.2  PLT 185 190 209   Cardiac Enzymes: No results for input(s): CKTOTAL, CKMB, CKMBINDEX, TROPONINI in the last 168 hours. BNP: Invalid input(s): POCBNP CBG: Recent Labs  Lab 11/28/19 1643 11/28/19 2107 11/29/19 0807 11/29/19 1204 11/29/19 1617  GLUCAP 166* 191* 120* 142* 147*   HbA1C: Recent Labs    11/27/19 2338  HGBA1C 6.4*   Urine analysis:    Component Value Date/Time   COLORURINE YELLOW 06/11/2008 1105   APPEARANCEUR CLEAR 06/11/2008 1105   LABSPEC 1.015 06/11/2008 1105   PHURINE 6.5 06/11/2008 1105   GLUCOSEU NEGATIVE 06/11/2008 1105   HGBUR NEGATIVE 06/11/2008 1105   BILIRUBINUR NEGATIVE 06/11/2008 1105   KETONESUR NEGATIVE 06/11/2008 1105   PROTEINUR NEGATIVE 06/11/2008 1105   UROBILINOGEN 0.2 06/11/2008 1105   NITRITE NEGATIVE 06/11/2008 1105   LEUKOCYTESUR  06/11/2008 1105    NEGATIVE MICROSCOPIC NOT DONE ON URINES WITH NEGATIVE PROTEIN, BLOOD, LEUKOCYTES, NITRITE, OR GLUCOSE <1000 mg/dL.   Sepsis Labs: @LABRCNTIP (procalcitonin:4,lacticidven:4) ) Recent Results (from the past 240 hour(s))  Blood Culture (routine x 2)     Status: None (Preliminary result)   Collection Time: 11/27/19  1:06 PM   Specimen: BLOOD  Result Value Ref Range Status   Specimen Description   Final    BLOOD LEFT ANTECUBITAL Performed at [facility], [address] [place], [place] [address]    Special Requests   Final    BOTTLES DRAWN AEROBIC AND ANAEROBIC Blood Culture adequate volume Performed at [facility], [address] [place], [place] [address]    Culture   Final    NO GROWTH 2 DAYS Performed at [facility], [address] [place], [place] [address]    Report Status PENDING  Incomplete  SARS CORONAVIRUS 2 (TAT 6-24 HRS) Nasopharyngeal Nasopharyngeal Swab     Status: Abnormal   Collection Time: 11/27/19  2:47 PM   Specimen: Nasopharyngeal Swab  Result Value Ref Range  Status   SARS Coronavirus 2 POSITIVE (A) NEGATIVE Final    Comment: RESULT CALLED TO, READ BACK BY AND VERIFIED WITH: Q MILNER,RN 2236 11/27/2019 D BRADLEY (NOTE) SARS-CoV-2 target nucleic acids are DETECTED. The SARS-CoV-2 RNA is generally detectable in upper and lower respiratory specimens during the acute phase of infection. Positive results are indicative of the presence of SARS-CoV-2 RNA. Clinical correlation with patient history and other diagnostic information is  necessary to determine patient infection status. Positive results do not rule out bacterial infection or co-infection with other viruses.  The expected result is Negative. Fact Sheet for Patients: HairSlick.nohttps://www.fda.gov/media/138098/download Fact Sheet for Healthcare Providers: quierodirigir.comhttps://www.fda.gov/media/138095/download This test is not yet approved or cleared by the [place] FDA and  has been authorized for detection and/or diagnosis of SARS-CoV-2 by FDA under an Emergency Use Authorization (EUA). This EUA will remain  in effect (meaning this test can  be used) for the  duration of the COVID-19 declaration under Section 564(b)(1) of the Act, 21 U.S.C. section 360bbb-3(b)(1), unless the authorization is terminated or revoked sooner. Performed at [facility], [place] [address], [place], [place] [address]      Scheduled Meds: . amLODipine  5 mg Oral Daily  . vitamin C  500 mg Oral Daily  . carvedilol  6.25 mg Oral QHS  . dexamethasone (DECADRON) injection  6 mg Intravenous Daily  . enoxaparin (LOVENOX) injection  60 mg Subcutaneous Q12H  . feeding supplement (PRO-STAT SUGAR FREE 64)  30 mL Oral TID  . ferrous sulfate  325 mg Oral BID  . guaiFENesin  600 mg Oral BID  . insulin aspart  0-5 Units Subcutaneous QHS  . insulin aspart  0-9 Units Subcutaneous TID WC  . levothyroxine  150 mcg Oral QPM  . multivitamin with minerals  1 tablet Oral Daily  . pantoprazole  40 mg Oral Daily  . zinc sulfate  220 mg Oral  Daily   Continuous Infusions: . remdesivir 100 mg in NS 100 mL 100 mg (11/29/19 1045)    Procedures/Studies: CT ANGIO CHEST PE W OR WO CONTRAST  Result Date: 11/27/2019 CLINICAL DATA:  85 year old female with elevated D-dimer and shortness of breath. EXAM: CT ANGIOGRAPHY CHEST WITH CONTRAST TECHNIQUE: Multidetector CT imaging of the chest was performed using the standard protocol during bolus administration of intravenous contrast. Multiplanar CT image reconstructions and MIPs were obtained to evaluate the vascular anatomy. CONTRAST:  143mL OMNIPAQUE IOHEXOL 350 MG/ML SOLN COMPARISON:  Chest radiograph dated 11/27/2019 and CT dated 11/04/2008. FINDINGS: Evaluation is limited due to streak artifact caused by patient's arms. Cardiovascular: There is mild cardiomegaly. No pericardial effusion. Coronary vascular calcifications noted. There is mild atherosclerotic calcification of the thoracic aorta. Mild dilatation of the main pulmonary trunk suggestive of a degree of pulmonary hypertension. Clinical correlation is recommended. Evaluation of the pulmonary arteries is very limited due to suboptimal opacification and timing of the contrast as well as severe respiratory motion artifact. No large or central pulmonary artery embolus identified. Mediastinum/Nodes: No definite hilar or mediastinal adenopathy. Small hiatal hernia. No mediastinal fluid collection. Lungs/Pleura: Bilateral streaky airspace densities most consistent with multifocal pneumonia, likely viral or atypical in etiology. Clinical correlation is recommended. No pleural effusion or pneumothorax. The central airways are patent. Upper Abdomen: Thickened appearance of the right adrenal gland, new since the prior CT. There is a 12 mm nodular lesion in the lateral limb of the left adrenal gland similar to prior CT. Evaluation of the upper abdomen is very limited on this CT due to respiratory motion artifact. Cholecystectomy. Musculoskeletal: Stable  appearing right paraspinal 1.5 x 2.0 cm nodular area (series 5, image 143). Degenerative changes of the spine. No acute osseous pathology. Review of the MIP images confirms the above findings. IMPRESSION: 1. No CT evidence of central pulmonary artery embolus. 2. Multifocal pneumonia, likely viral or atypical in etiology. Clinical correlation and follow-up to resolution recommended. 3. Mild cardiomegaly with coronary vascular calcifications. 4. Aortic Atherosclerosis (ICD10-I70.0). Electronically Signed   By: Anner Crete M.D.   On: 11/27/2019 22:17   DG Chest Port 1 View  Result Date: 11/27/2019 CLINICAL DATA:  Cough EXAM: PORTABLE CHEST 1 VIEW COMPARISON:  May 10, 2019 FINDINGS: There is ill-defined opacity in the medial right base. The lungs elsewhere are clear. Heart size and pulmonary vascularity are normal. No adenopathy. There are surgical clips in the thyroid region. IMPRESSION: Ill-defined opacity  medial right base, likely due to developing pneumonia. Lungs elsewhere clear. Cardiac silhouette normal. Status post thyroidectomy. No adenopathy evident. Electronically Signed   By: Bretta BangWilliam  Woodruff III M.D.   On: 11/27/2019 14:35   VAS US LOWER EXTREMITY VENOUS (DVT)  Result Date: 11/28/2019  Lower Venous Study Indications: Elevated d dimer.  Limitations: Poor ultrasound/tissue interface, body habitus and pain tolerance. Comparison Study: no prior Performing Technologist: Blanch MediaMegan Riddle RVS  Examination Guidelines: A complete evaluation includes B-mode imaging, spectral Doppler, color Doppler, and power Doppler as needed of all accessible portions of each vessel. Bilateral testing is considered an integral part of a complete examination. Limited examinations for reoccurring indications may be performed as noted.  +---------+---------------+---------+-----------+----------+--------------+ RIGHT    CompressibilityPhasicitySpontaneityPropertiesThrombus Aging  +---------+---------------+---------+-----------+----------+--------------+ CFV      Full           Yes      Yes                                 +---------+---------------+---------+-----------+----------+--------------+ SFJ      Full                                                        +---------+---------------+---------+-----------+----------+--------------+ FV Prox  Full                                                        +---------+---------------+---------+-----------+----------+--------------+ FV Mid                  Yes      Yes                                 +---------+---------------+---------+-----------+----------+--------------+ FV Distal                                             Not visualized +---------+---------------+---------+-----------+----------+--------------+ PFV      Full                                                        +---------+---------------+---------+-----------+----------+--------------+ POP      Full                                                        +---------+---------------+---------+-----------+----------+--------------+ PTV                                                   Not visualized +---------+---------------+---------+-----------+----------+--------------+ PERO  Not visualized +---------+---------------+---------+-----------+----------+--------------+   +---------+---------------+---------+-----------+----------+--------------+ LEFT     CompressibilityPhasicitySpontaneityPropertiesThrombus Aging +---------+---------------+---------+-----------+----------+--------------+ CFV      Full           Yes      Yes                                 +---------+---------------+---------+-----------+----------+--------------+ SFJ      Full                                                         +---------+---------------+---------+-----------+----------+--------------+ FV Prox  Full                                                        +---------+---------------+---------+-----------+----------+--------------+ FV Mid                  Yes      Yes                                 +---------+---------------+---------+-----------+----------+--------------+ FV Distal                                             Not visualized +---------+---------------+---------+-----------+----------+--------------+ PFV      Full                                                        +---------+---------------+---------+-----------+----------+--------------+ POP      Full           Yes      Yes                                 +---------+---------------+---------+-----------+----------+--------------+ PTV                                                   Not visualized +---------+---------------+---------+-----------+----------+--------------+ PERO                                                  Not visualized +---------+---------------+---------+-----------+----------+--------------+     Summary: Right: There is no evidence of deep vein thrombosis in the lower extremity. However, portions of this examination were limited- see technologist comments above. No cystic structure found in the popliteal fossa. Left: There is no evidence of deep vein thrombosis in the lower extremity. However, portions of this examination were limited- see technologist comments above. No cystic structure found in the popliteal fossa.  *  See table(s) above for measurements and observations. Electronically signed by Gretta Began MD on 11/28/2019 at 4:40:48 PM.    Final     Lynden Oxford,  [facility]  If 7PM-7AM, please contact night-coverage [email or web address] Password [email or web address] 11/29/2019, 5:34 PM   LOS: 2 days

## 2019-11-29 NOTE — Progress Notes (Signed)
Initial Nutrition Assessment  RD working remotely.   DOCUMENTATION CODES:   Morbid obesity  INTERVENTION:  - will order 30 mL Prostat BID, each supplement provides 100 kcal and 15 grams of protein. - will order daily multivitamin with minerals.  - continue to encourage PO intakes.    NUTRITION DIAGNOSIS:   Increased nutrient needs related to wound healing, acute illness(COVID-19) as evidenced by estimated needs.  GOAL:   Patient will meet greater than or equal to 90% of their needs  MONITOR:   PO intake, Supplement acceptance, Labs, Weight trends  REASON FOR ASSESSMENT:   Other (Comment)(Pressure Injury report)  ASSESSMENT:   85 year old female with history of DM, HTN, GERD, and lower extremity lymphedema. She presented to the ED from her doctor's office for SOB and productive cough. Her O2 sats were in the high 80s on room air. Patient reported that her daughter and some other family members are currently COVID positive. She was having fever and chills PTA. COVID-19 PCR in the ED was found to be positive.  No intakes documented since admission. Patient is unsure if she has lost any weight recently, but reports decreasing appetite over the past 3 weeks and that she sometimes does becoming increasingly SOB with PO intakes.   Per chart review, current weight is 275 lb and weight appears to have been stable for several years.   Per notes: - acute respiratory failure with hypoxia 2/2 COVID PNA - generalized debility  - plan is for d/c home in likely 2-3 days    Labs reviewed; CBG: 120 mg/dl, creatinine: 1.2 mg/dl, Ca: 8.3 mg/dl, GFR: 42 ml/min.  Medications reviewed; 500 mg ascorbic acid/day, 325 mg ferrous sulfate BID, sliding scale novolog, 150 mcg oral synthroid/day, 200 mg remdesivir x1 dose 1/5, 100 mg remdesivir x1 dose/day 1/6-1/9, 220 mg zinc sulfate/day.  IVF; NS @ 100 ml/hr.     NUTRITION - FOCUSED PHYSICAL EXAM:  unable to complete for COVID+  patient  Diet Order:   Diet Order            Diet Carb Modified Fluid consistency: Thin; Room service appropriate? Yes  Diet effective now              EDUCATION NEEDS:   No education needs have been identified at this time  Skin:  Skin Assessment: Skin Integrity Issues: Skin Integrity Issues:: Stage II Stage II: R buttocks  Last BM:  PTA  Height:   Ht Readings from Last 1 Encounters:  11/28/19 5\' 2"  (1.575 m)    Weight:   Wt Readings from Last 1 Encounters:  11/28/19 124.6 kg    Ideal Body Weight:  50 kg  BMI:  Body mass index is 50.24 kg/m.  Estimated Nutritional Needs:   Kcal:  1850-2100 kcal  Protein:  92-105  Fluid:  >/= 1.8 L/day     01/26/20, MS, RD, LDN, [facility] Inpatient Clinical Dietitian Pager # [phone] After hours/weekend pager # [phone]

## 2019-11-30 DIAGNOSIS — Z794 Long term (current) use of insulin: Secondary | ICD-10-CM

## 2019-11-30 DIAGNOSIS — E039 Hypothyroidism, unspecified: Secondary | ICD-10-CM

## 2019-11-30 DIAGNOSIS — L8992 Pressure ulcer of unspecified site, stage 2: Secondary | ICD-10-CM

## 2019-11-30 DIAGNOSIS — J1282 Pneumonia due to coronavirus disease 2019: Secondary | ICD-10-CM

## 2019-11-30 DIAGNOSIS — E1165 Type 2 diabetes mellitus with hyperglycemia: Secondary | ICD-10-CM

## 2019-11-30 DIAGNOSIS — N1831 Chronic kidney disease, stage 3a: Secondary | ICD-10-CM

## 2019-11-30 LAB — CBC WITH DIFFERENTIAL/PLATELET
Abs Immature Granulocytes: 0.15 10*3/uL — ABNORMAL HIGH (ref 0.00–0.07)
Basophils Absolute: 0 10*3/uL (ref 0.0–0.1)
Basophils Relative: 0 %
Eosinophils Absolute: 0 10*3/uL (ref 0.0–0.5)
Eosinophils Relative: 0 %
HCT: 40.2 % (ref 36.0–46.0)
Hemoglobin: 12.4 g/dL (ref 12.0–15.0)
Immature Granulocytes: 1 %
Lymphocytes Relative: 15 %
Lymphs Abs: 2 10*3/uL (ref 0.7–4.0)
MCH: 29.8 pg (ref 26.0–34.0)
MCHC: 30.8 g/dL (ref 30.0–36.0)
MCV: 96.6 fL (ref 80.0–100.0)
Monocytes Absolute: 0.7 10*3/uL (ref 0.1–1.0)
Monocytes Relative: 5 %
Neutro Abs: 10.6 10*3/uL — ABNORMAL HIGH (ref 1.7–7.7)
Neutrophils Relative %: 79 %
Platelets: 204 10*3/uL (ref 150–400)
RBC: 4.16 MIL/uL (ref 3.87–5.11)
RDW: 14.8 % (ref 11.5–15.5)
WBC: 13.6 10*3/uL — ABNORMAL HIGH (ref 4.0–10.5)
nRBC: 0 % (ref 0.0–0.2)

## 2019-11-30 LAB — COMPREHENSIVE METABOLIC PANEL
ALT: 16 U/L (ref 0–44)
AST: 35 U/L (ref 15–41)
Albumin: 2.8 g/dL — ABNORMAL LOW (ref 3.5–5.0)
Alkaline Phosphatase: 191 U/L — ABNORMAL HIGH (ref 38–126)
Anion gap: 10 (ref 5–15)
BUN: 33 mg/dL — ABNORMAL HIGH (ref 8–23)
CO2: 27 mmol/L (ref 22–32)
Calcium: 8.3 mg/dL — ABNORMAL LOW (ref 8.9–10.3)
Chloride: 101 mmol/L (ref 98–111)
Creatinine, Ser: 1.13 mg/dL — ABNORMAL HIGH (ref 0.44–1.00)
GFR calc Af Amer: 52 mL/min — ABNORMAL LOW (ref >60)
GFR calc non Af Amer: 45 mL/min — ABNORMAL LOW (ref >60)
Glucose, Bld: 139 mg/dL — ABNORMAL HIGH (ref 70–99)
Potassium: 5.1 mmol/L (ref 3.5–5.1)
Sodium: 138 mmol/L (ref 135–145)
Total Bilirubin: 0.8 mg/dL (ref 0.3–1.2)
Total Protein: 6.7 g/dL (ref 6.5–8.1)

## 2019-11-30 LAB — MAGNESIUM: Magnesium: 2.2 mg/dL (ref 1.7–2.4)

## 2019-11-30 LAB — GLUCOSE, CAPILLARY
Glucose-Capillary: 104 mg/dL — ABNORMAL HIGH (ref 70–99)
Glucose-Capillary: 122 mg/dL — ABNORMAL HIGH (ref 70–99)
Glucose-Capillary: 192 mg/dL — ABNORMAL HIGH (ref 70–99)
Glucose-Capillary: 161 mg/dL — ABNORMAL HIGH (ref 70–99)

## 2019-11-30 LAB — PHOSPHORUS: Phosphorus: 3.7 mg/dL (ref 2.5–4.6)

## 2019-11-30 LAB — FERRITIN: Ferritin: 39 ng/mL (ref 11–307)

## 2019-11-30 LAB — C-REACTIVE PROTEIN: CRP: 3 mg/dL — ABNORMAL HIGH (ref <1.0)

## 2019-11-30 LAB — D-DIMER, QUANTITATIVE: D-Dimer, Quant: 3.46 ug/mL-FEU — ABNORMAL HIGH (ref 0.00–0.50)

## 2019-11-30 MED ORDER — LINAGLIPTIN 5 MG PO TABS
5.0000 mg | ORAL_TABLET | Freq: Every day | ORAL | Status: DC
  Administered 2019-11-30 – 2019-12-01 (×2): 5 mg via ORAL
  Filled 2019-11-30 (×2): qty 1

## 2019-11-30 NOTE — TOC Progression Note (Signed)
Transition of Care [facility]) - Progression Note    Patient Details  Name: Brandy Morris MRN: [id number] Date of Birth: 04/21/1935  Transition of Care [facility]) CM/SW Contact  Geni Bers, RN Phone Number: 11/30/2019, 9:54 AM  Clinical Narrative:     Pt now going home with [facility] for [facility]. In house rep aware and pt agreed for her and husband.        Expected Discharge Plan and Services                                                 Social Determinants of Health (SDOH) Interventions    Readmission Risk Interventions No flowsheet data found.

## 2019-11-30 NOTE — Care Management Important Message (Signed)
Important Message  Patient Details IM Letter given to Windell Moulding SW Case Manager to present to the Patient Name: SHAMECKA HOCUTT MRN: [id number] Date of Birth: 1935-07-18   Medicare Important Message Given:  Yes     Caren Macadam 11/30/2019, 11:44 AM

## 2019-11-30 NOTE — Progress Notes (Signed)
Physical Therapy Treatment Patient Details Name: Brandy Morris MRN: [id number] DOB: [birth year] Today's Date: 11/30/2019    History of Present Illness 86 year old female with history of diabetes mellitus, hypertension, GERD, lower extremity lymphedema presented from her doctor's office for shortness of breath with productive cough.  Her O2 sats were found to be 87% on room air. Pt has family members who are covid +.  Dx of covid-19, PNA.    PT Comments    Pt OOB in recliner feeling "better".  General Gait Details: pt tolerated a 4 min walk around room with avg RA 90% Pt plans to D/C to home tomorrow with spouse who is also a pt on our COVID Unit.    Follow Up Recommendations  Home health PT     Equipment Recommendations       Recommendations for Other Services       Precautions / Restrictions Precautions Precautions: Fall Precaution Comments: monitor O2 Restrictions Weight Bearing Restrictions: No    Mobility  Bed Mobility               General bed mobility comments: OOB in recliner  Transfers Overall transfer level: Needs assistance Equipment used: Rolling walker (2 wheeled) Transfers: Sit to/from Stand Sit to Stand: Supervision;Min guard         General transfer comment: pt able to self rise from recliner and onto/off BSC  Ambulation/Gait Ambulation/Gait assistance: Supervision Gait Distance (Feet): 75 Feet(tolerated a 4 min walk around room) Assistive device: Rolling walker (2 wheeled) Gait Pattern/deviations: Step-through pattern;Decreased stride length Gait velocity: decreased   General Gait Details: pt tolerated a 4 min walk around room with avg RA 90%   Stairs             Wheelchair Mobility    Modified Rankin (Stroke Patients Only)       Balance                                            Cognition Arousal/Alertness: Awake/alert Behavior During Therapy: WFL for tasks assessed/performed Overall Cognitive  Status: Within Functional Limits for tasks assessed                                 General Comments: motivated and tough      Exercises      General Comments        Pertinent Vitals/Pain Pain Assessment: Faces Faces Pain Scale: Hurts a little bit Pain Location: B LE "Neuropathy" Pain Descriptors / Indicators: Aching    Home Living                      Prior Function            PT Goals (current goals can now be found in the care plan section) Progress towards PT goals: Progressing toward goals    Frequency    Min 3X/week      PT Plan Current plan remains appropriate    Co-evaluation              AM-PAC PT "6 Clicks" Mobility   Outcome Measure  Help needed turning from your back to your side while in a flat bed without using bedrails?: A Little Help needed moving from lying on your back to sitting on the side of a  flat bed without using bedrails?: A Little Help needed moving to and from a bed to a chair (including a wheelchair)?: A Little Help needed standing up from a chair using your arms (e.g., wheelchair or bedside chair)?: A Little Help needed to walk in hospital room?: A Little Help needed climbing 3-5 steps with a railing? : A Little 6 Click Score: 18    End of Session Equipment Utilized During Treatment: Gait belt;Oxygen Activity Tolerance: Patient tolerated treatment well Patient left: in chair;with call bell/phone within reach Nurse Communication: Mobility status PT Visit Diagnosis: Difficulty in walking, not elsewhere classified (R26.2);Muscle weakness (generalized) (M62.81)     Time: 7014-1030 PT Time Calculation (min) (ACUTE ONLY): 26 min  Charges:  $Gait Training: 8-22 mins $Therapeutic Activity: 8-22 mins                     Rica Koyanagi  PTA Acute  Rehabilitation Services Pager      [phone] Office      [phone]

## 2019-11-30 NOTE — Progress Notes (Signed)
Pt care assumed at 1530 this shift from previous RN. I have reviewed the previous RN's assessment and agree with her findings. Pt is currently up in the chair in no current distress or pain. Will continue to monitor.

## 2019-11-30 NOTE — Progress Notes (Signed)
PROGRESS NOTE  Brandy Morris:[id number] DOB: 11-Sep-1935   PCP: [name], MD  Patient is from: Home.  Independently ambulates at baseline.  DOA: 11/27/2019 LOS: 3  Brief Narrative / Interim history: 85 year old female with history of DM-2, HTN, morbid obesity, lymphedema and GERD presenting from PCP office with shortness of breath, productive cough and hypoxia to 87% on RA, and admitted for acute respiratory failure due to COVID-19 pneumonia.  Husband admitted for COVID-19 infection at the same time. Daughter diagnosed with Covid as well. Admitted and started on remdesivir and Decadron.   Subjective: Events overnight of this morning.  No complaints.  Feels better.  Saturating between 92% and 96% on room air when I discontinued her oxygen.  Denies shortness of breath, cough, chest pain, GI or UTI symptoms. She echoes her husband about a preference to go home with home health.   Objective: Vitals:   11/29/19 1331 11/29/19 2222 11/30/19 0556 11/30/19 1447  BP: (!) 178/79 (!) 154/65 (!) 145/67 (!) 163/97  Pulse: 63 (!) 53 65 (!) 56  Resp: 20 18 18 17   Temp: 98.5 F (36.9 C) 97.9 F (36.6 C) 98.7 F (37.1 C) 98.4 F (36.9 C)  TempSrc: Oral  Oral Oral  SpO2: 96% 96% 98% 91%  Weight:      Height:        Intake/Output Summary (Last 24 hours) at 11/30/2019 1718 Last data filed at 11/30/2019 1500 Gross per 24 hour  Intake 0 ml  Output 400 ml  Net -400 ml   Filed Weights   11/28/19 0927  Weight: 124.6 kg    Examination:  GENERAL: No acute distress.  Appears well.  HEENT: MMM.  Vision and hearing grossly intact.  NECK: Supple.  No apparent JVD.  RESP: 90 to 96% on RA.  No IWOB.  Diminished aeration partly due to poor inspiratory effort. CVS:  RRR. Heart sounds normal.  ABD/GI/GU: Bowel sounds present. Soft. Non tender.  MSK/EXT:  Moves extremities. No apparent deformity. No edema.  SKIN: no apparent skin lesion or wound NEURO: Awake, alert and oriented  appropriately.  No apparent focal neuro deficit. PSYCH: Calm. Normal affect.  Procedures:  None  Assessment & Plan: Acute respiratory failure with hypoxia due to COVID-19 pneumonia: Hypoxic to 87% on admission.  CTA chest negative for PE but multifocal pneumonia.  Inflammatory markers were elevated but improving. Recent Labs    11/28/19 0551 11/29/19 0250 11/30/19 0251  DDIMER 11.80* 6.58* 3.46*  FERRITIN 42 42 39  CRP 7.8* 4.9* 3.0*  -Dexamethasone on remdesivir 1/5> -Subcu Lovenox for VT prophylaxis. -Inhalers, mucolytic's, antitussive, vitamin C, zinc and incentive spirometry. -OOB/PT/OT and daily ambulation -Trend inflammatory markers.  Controlled IDDM-2 with hyperglycemia in the setting of steroid: A1c 6.4%. Recent Labs    11/30/19 0806 11/30/19 1125 11/30/19 1643  GLUCAP 104* 122* 192*  -Add Tradjenta. -Continue SSI-thin -Will start a statin on discharge.  CKD-3a/mild azotemia: Cr 1.09 (02/2016)>> 1.08 (admit)> 1.20> 1.13.  -Continue holding Lasix and lisinopril. -Continue monitoring -Encouraged to avoid or minimize NSAIDs.  Essential hypertension: Normotensive. -Continue home amlodipine, Coreg and as needed hydralazine  Hypothyroidism -Continue home Synthroid  Lymphedema -Continue holding Lasix. -Encourage leg elevation.  GERD -Continue PPI.  Morbid obesity: BMI 50.24. -Encourage lifestyle change to lose weight. -Could benefit from GLP-1 inhibitors  Leukocytosis/bandemia: Likely due to steroid. -We will continue monitoring  Anxiety: -Continue as needed Atarax.  Stage II pressure injury: POA Pressure Injury 11/28/19 Buttocks Right Stage 2 -  Partial thickness loss of dermis presenting as a shallow open injury with a red, pink wound bed without slough. Two dime size wounds in the folds of her right butt cheek. (Active)  11/28/19 0915  Location: Buttocks  Location Orientation: Right  Staging: Stage 2 -  Partial thickness loss of dermis presenting  as a shallow open injury with a red, pink wound bed without slough.  Wound Description (Comments): Two dime size wounds in the folds of her right butt cheek.  Present on Admission:      Nutrition Problem: Increased nutrient needs Etiology: wound healing, acute illness(COVID-19)  Signs/Symptoms: estimated needs  Interventions: Prostat, MVI   DVT prophylaxis: Subcu Lovenox Code Status: Full code Family Communication: Updated patient's husband in person and son over the phone Disposition Plan: Anticipate discharge home on 1/9 with home health Consultants: None   Microbiology summarized: COVID-19 positive. Blood cultures negative.  Sch Meds:  Scheduled Meds: . amLODipine  5 mg Oral Daily  . vitamin C  500 mg Oral Daily  . carvedilol  6.25 mg Oral QHS  . dexamethasone (DECADRON) injection  6 mg Intravenous Daily  . enoxaparin (LOVENOX) injection  60 mg Subcutaneous Q12H  . feeding supplement (PRO-STAT SUGAR FREE 64)  30 mL Oral TID  . ferrous sulfate  325 mg Oral BID  . guaiFENesin  600 mg Oral BID  . insulin aspart  0-5 Units Subcutaneous QHS  . insulin aspart  0-9 Units Subcutaneous TID WC  . levothyroxine  150 mcg Oral QPM  . linagliptin  5 mg Oral Daily  . multivitamin with minerals  1 tablet Oral Daily  . pantoprazole  40 mg Oral Daily  . zinc sulfate  220 mg Oral Daily   Continuous Infusions: . remdesivir 100 mg in NS 100 mL 100 mg (11/30/19 1009)   PRN Meds:.hydrALAZINE, hydrOXYzine  Antimicrobials: Anti-infectives (From admission, onward)   Start     Dose/Rate Route Frequency Ordered Stop   11/28/19 1000  remdesivir 100 mg in sodium chloride 0.9 % 100 mL IVPB     100 mg 200 mL/hr over 30 Minutes Intravenous Daily 11/27/19 2250 12/02/19 0959   11/27/19 2300  azithromycin (ZITHROMAX) 500 mg in sodium chloride 0.9 % 250 mL IVPB  Status:  Discontinued     500 mg 250 mL/hr over 60 Minutes Intravenous Every 24 hours 11/27/19 2256 11/27/19 2259   11/27/19 2300   cefTRIAXone (ROCEPHIN) 2 g in sodium chloride 0.9 % 100 mL IVPB  Status:  Discontinued     2 g 200 mL/hr over 30 Minutes Intravenous Every 24 hours 11/27/19 2256 11/27/19 2259   11/27/19 2300  remdesivir 200 mg in sodium chloride 0.9% 250 mL IVPB     200 mg 580 mL/hr over 30 Minutes Intravenous Once 11/27/19 2250 11/28/19 0055   11/27/19 1530  azithromycin (ZITHROMAX) 500 mg in sodium chloride 0.9 % 250 mL IVPB  Status:  Discontinued     500 mg 250 mL/hr over 60 Minutes Intravenous Every 24 hours 11/27/19 1501 11/28/19 1021   11/27/19 1515  cefTRIAXone (ROCEPHIN) 2 g in sodium chloride 0.9 % 100 mL IVPB  Status:  Discontinued     2 g 200 mL/hr over 30 Minutes Intravenous Every 24 hours 11/27/19 1501 11/28/19 1021       I have personally reviewed the following labs and images: CBC: Recent Labs  Lab 11/27/19 1351 11/28/19 0551 11/29/19 0250 11/30/19 0251  WBC 12.9* 8.5 11.1* 13.6*  NEUTROABS 9.1* 7.1 8.5*  10.6*  HGB 12.4 12.4 11.6* 12.4  HCT 40.0 40.0 37.1 40.2  MCV 96.4 95.2 94.2 96.6  PLT 185 190 209 204   BMP &GFR Recent Labs  Lab 11/27/19 1351 11/28/19 0551 11/29/19 0250 11/30/19 0251  NA 141 139 137 138  K 4.3 4.9 4.6 5.1  CL 102 99 100 101  CO2 30 29 27 27   GLUCOSE 85 170* 154* 139*  BUN 18 18 23  33*  CREATININE 1.08* 1.12* 1.20* 1.13*  CALCIUM 8.5* 8.5* 8.3* 8.3*  MG  --  2.2 2.3 2.2  PHOS  --  2.8 2.6 3.7   Estimated Creatinine Clearance: 48.4 mL/min (A) (by C-G formula based on SCr of 1.13 mg/dL (H)). Liver & Pancreas: Recent Labs  Lab 11/27/19 1351 11/28/19 0551 11/29/19 0250 11/30/19 0251  AST 36 34 26 35  ALT 18 18 15 16   ALKPHOS 217* 217* 182* 191*  BILITOT 0.7 0.7 0.6 0.8  PROT 7.2 7.0 6.5 6.7  ALBUMIN 3.0* 3.0* 2.7* 2.8*   No results for input(s): LIPASE, AMYLASE in the last 168 hours. No results for input(s): AMMONIA in the last 168 hours. Diabetic: Recent Labs    11/27/19 2338  HGBA1C 6.4*   Recent Labs  Lab 11/29/19 1617  11/29/19 2220 11/30/19 0806 11/30/19 1125 11/30/19 1643  GLUCAP 147* 187* 104* 122* 192*   Cardiac Enzymes: No results for input(s): CKTOTAL, CKMB, CKMBINDEX, TROPONINI in the last 168 hours. No results for input(s): PROBNP in the last 8760 hours. Coagulation Profile: No results for input(s): INR, PROTIME in the last 168 hours. Thyroid Function Tests: No results for input(s): TSH, T4TOTAL, FREET4, T3FREE, THYROIDAB in the last 72 hours. Lipid Profile: No results for input(s): CHOL, HDL, LDLCALC, TRIG, CHOLHDL, LDLDIRECT in the last 72 hours. Anemia Panel: Recent Labs    11/29/19 0250 11/30/19 0251  FERRITIN 42 39   Urine analysis:    Component Value Date/Time   COLORURINE YELLOW 06/11/2008 1105   APPEARANCEUR CLEAR 06/11/2008 1105   LABSPEC 1.015 06/11/2008 1105   PHURINE 6.5 06/11/2008 1105   GLUCOSEU NEGATIVE 06/11/2008 1105   HGBUR NEGATIVE 06/11/2008 1105   BILIRUBINUR NEGATIVE 06/11/2008 1105   KETONESUR NEGATIVE 06/11/2008 1105   PROTEINUR NEGATIVE 06/11/2008 1105   UROBILINOGEN 0.2 06/11/2008 1105   NITRITE NEGATIVE 06/11/2008 1105   LEUKOCYTESUR  06/11/2008 1105    NEGATIVE MICROSCOPIC NOT DONE ON URINES WITH NEGATIVE PROTEIN, BLOOD, LEUKOCYTES, NITRITE, OR GLUCOSE <1000 mg/dL.   Sepsis Labs: Invalid input(s): PROCALCITONIN, LACTICIDVEN  Microbiology: Recent Results (from the past 240 hour(s))  Blood Culture (routine x 2)     Status: None (Preliminary result)   Collection Time: 11/27/19  1:06 PM   Specimen: BLOOD  Result Value Ref Range Status   Specimen Description   Final    BLOOD LEFT ANTECUBITAL Performed at [facility], [address], [place], [facility] [address]    Special Requests   Final    BOTTLES DRAWN AEROBIC AND ANAEROBIC Blood Culture adequate volume Performed at [facility], [address], [place], [place]    Culture   Final    NO GROWTH 3 DAYS Performed at [facility], [address],  [place], [facility] [address]    Report Status PENDING  Incomplete  SARS CORONAVIRUS 2 (TAT 6-24 HRS) Nasopharyngeal Nasopharyngeal Swab     Status: Abnormal   Collection Time: 11/27/19  2:47 PM   Specimen: Nasopharyngeal Swab  Result Value Ref Range  Status   SARS Coronavirus 2 POSITIVE (A) NEGATIVE Final    Comment: RESULT CALLED TO, READ BACK BY AND VERIFIED WITH: Q MILNER,RN [id number] 11/27/2019 D BRADLEY (NOTE) SARS-CoV-2 target nucleic acids are DETECTED. The SARS-CoV-2 RNA is generally detectable in upper and lower respiratory specimens during the acute phase of infection. Positive results are indicative of the presence of SARS-CoV-2 RNA. Clinical correlation with patient history and other diagnostic information is  necessary to determine patient infection status. Positive results do not rule out bacterial infection or co-infection with other viruses.  The expected result is Negative. Fact Sheet for Patients: SugarRoll.be Fact Sheet for Healthcare Providers: https://www.woods-mathews.com/ This test is not yet approved or cleared by the [place] FDA and  has been authorized for detection and/or diagnosis of SARS-CoV-2 by FDA under an Emergency Use Authorization (EUA). This EUA will remain  in effect (meaning this test can be used) for the  duration of the COVID-19 declaration under Section 564(b)(1) of the Act, 21 U.S.C. section 360bbb-3(b)(1), unless the authorization is terminated or revoked sooner. Performed at [facility], [place] [address], [place], [place] [address]     Radiology Studies: No results found.    Keondria Siever T. [place]  If 7PM-7AM, please contact night-coverage [email or web address] Password [facility] 11/30/2019, 5:18 PM

## 2019-12-01 DIAGNOSIS — E1121 Type 2 diabetes mellitus with diabetic nephropathy: Secondary | ICD-10-CM

## 2019-12-01 DIAGNOSIS — R5381 Other malaise: Secondary | ICD-10-CM

## 2019-12-01 DIAGNOSIS — Z6841 Body Mass Index (BMI) 40.0 and over, adult: Secondary | ICD-10-CM

## 2019-12-01 LAB — GLUCOSE, CAPILLARY: Glucose-Capillary: 99 mg/dL (ref 70–99)

## 2019-12-01 LAB — COMPREHENSIVE METABOLIC PANEL
ALT: 28 U/L (ref 0–44)
AST: 47 U/L — ABNORMAL HIGH (ref 15–41)
Albumin: 2.9 g/dL — ABNORMAL LOW (ref 3.5–5.0)
Alkaline Phosphatase: 214 U/L — ABNORMAL HIGH (ref 38–126)
Anion gap: 8 (ref 5–15)
BUN: 39 mg/dL — ABNORMAL HIGH (ref 8–23)
CO2: 30 mmol/L (ref 22–32)
Calcium: 8.8 mg/dL — ABNORMAL LOW (ref 8.9–10.3)
Chloride: 103 mmol/L (ref 98–111)
Creatinine, Ser: 1.15 mg/dL — ABNORMAL HIGH (ref 0.44–1.00)
GFR calc Af Amer: 51 mL/min — ABNORMAL LOW (ref >60)
GFR calc non Af Amer: 44 mL/min — ABNORMAL LOW (ref >60)
Glucose, Bld: 119 mg/dL — ABNORMAL HIGH (ref 70–99)
Potassium: 5.4 mmol/L — ABNORMAL HIGH (ref 3.5–5.1)
Sodium: 141 mmol/L (ref 135–145)
Total Bilirubin: 0.4 mg/dL (ref 0.3–1.2)
Total Protein: 7 g/dL (ref 6.5–8.1)

## 2019-12-01 LAB — CBC WITH DIFFERENTIAL/PLATELET
Abs Immature Granulocytes: 0.26 10*3/uL — ABNORMAL HIGH (ref 0.00–0.07)
Basophils Absolute: 0.1 10*3/uL (ref 0.0–0.1)
Basophils Relative: 0 %
Eosinophils Absolute: 0.1 10*3/uL (ref 0.0–0.5)
Eosinophils Relative: 1 %
HCT: 43.3 % (ref 36.0–46.0)
Hemoglobin: 13.3 g/dL (ref 12.0–15.0)
Immature Granulocytes: 2 %
Lymphocytes Relative: 15 %
Lymphs Abs: 1.8 10*3/uL (ref 0.7–4.0)
MCH: 30 pg (ref 26.0–34.0)
MCHC: 30.7 g/dL (ref 30.0–36.0)
MCV: 97.5 fL (ref 80.0–100.0)
Monocytes Absolute: 0.9 10*3/uL (ref 0.1–1.0)
Monocytes Relative: 8 %
Neutro Abs: 8.6 10*3/uL — ABNORMAL HIGH (ref 1.7–7.7)
Neutrophils Relative %: 74 %
Platelets: 236 10*3/uL (ref 150–400)
RBC: 4.44 MIL/uL (ref 3.87–5.11)
RDW: 14.6 % (ref 11.5–15.5)
WBC: 11.7 10*3/uL — ABNORMAL HIGH (ref 4.0–10.5)
nRBC: 0 % (ref 0.0–0.2)

## 2019-12-01 LAB — FERRITIN: Ferritin: 50 ng/mL (ref 11–307)

## 2019-12-01 LAB — PHOSPHORUS: Phosphorus: 3.6 mg/dL (ref 2.5–4.6)

## 2019-12-01 LAB — MAGNESIUM: Magnesium: 2.4 mg/dL (ref 1.7–2.4)

## 2019-12-01 LAB — D-DIMER, QUANTITATIVE: D-Dimer, Quant: 3.05 ug/mL-FEU — ABNORMAL HIGH (ref 0.00–0.50)

## 2019-12-01 LAB — C-REACTIVE PROTEIN: CRP: 2.1 mg/dL — ABNORMAL HIGH (ref <1.0)

## 2019-12-01 MED ORDER — FUROSEMIDE 20 MG PO TABS
20.0000 mg | ORAL_TABLET | Freq: Every day | ORAL | Status: DC
  Administered 2019-12-01: 11:00:00 20 mg via ORAL
  Filled 2019-12-01: qty 1

## 2019-12-01 MED ORDER — ALBUTEROL SULFATE HFA 108 (90 BASE) MCG/ACT IN AERS: 2.0000 | INHALATION_SPRAY | Freq: Four times a day (QID) | RESPIRATORY_TRACT | 0 refills | Status: DC | PRN

## 2019-12-01 MED ORDER — DM-GUAIFENESIN ER 30-600 MG PO TB12: 1.0000 | ORAL_TABLET | Freq: Two times a day (BID) | ORAL | 0 refills | Status: DC

## 2019-12-01 MED ORDER — ACETAMINOPHEN 500 MG PO TABS: 1000.0000 mg | ORAL_TABLET | Freq: Three times a day (TID) | ORAL | 2 refills | Status: AC

## 2019-12-01 NOTE — Discharge Summary (Signed)
Physician Discharge Summary  SHELL BLANCHETTE RDE:[id number] DOB: [birth year] DOA: 11/27/2019  PCP: Karleen Hampshire., MD  Admit date: 11/27/2019 Discharge date: 12/01/2019  Admitted From: Home Disposition: Home  Recommendations for Outpatient Follow-up:  1. Follow ups as below. 2. Please obtain CBC/BMP/Mag at follow up 3. Please follow up on the following pending results: None  Home Health: PT/OT/RN Equipment/Devices: None  Discharge Condition: Stable CODE STATUS: Full code  Follow-up Information    Karleen Hampshire., MD. Schedule an appointment as soon as possible for a visit in 2 week(s).   Specialty: Internal Medicine Contact information: [address] [place] [address] [phone]        Care, [facility] Follow up.   Specialty: Home Health Services Why: agency will provide home health physical therapy, occupational therapy, and nurse. Contact information: [address] [place] [address] [phone]           Hospital Course: 85 year old female with history of DM-2, HTN, morbid obesity, lymphedema and GERD presenting from PCP office with shortness of breath, productive cough and hypoxia to 87% on RA, and admitted for acute respiratory failure due to COVID-19 pneumonia.  Started on remdesivir and Decadron. Husband admitted for COVID-19 infection at the same time, and was under my care. Daughter diagnosed with COVID-19 as well.  Patient completed 5 days course of remdesivir and Decadron with significant improvement in his symptoms and his oxygen requirement.  On the day of discharge, ambulated on room air maintain saturation greater than 89% with minimal distress.   Patient and patient's husband were evaluated by PT/OT who recommended SNF but they declined SNF and chose to go home with home health.   Patient was counseled on infection prevention and return precautions.   See individual problem list below for more on hospital  course.  Discharge Diagnoses:  Acute respiratory failure with hypoxia due to COVID-19 pneumonia: Hypoxic to 87% on admission.  CTA chest negative for PE but multifocal pneumonia.  Completed 5 days course of remdesivir and Decadron with improvement in her respiratory symptoms and inflammatory markers.  Ambulated on room air maintain saturation greater than 89%. Recent Labs    11/29/19 0250 11/30/19 0251 12/01/19 0307  DDIMER 6.58* 3.46* 3.05*  FERRITIN 42 39 50  CRP 4.9* 3.0* 2.1*  -Discharged on inhalers and mucolytics -Encourage incentive spirometry and mobilization -PT/OT/RN ordered.  Controlled IDDM-2 with hyperglycemia in the setting of steroid: A1c 6.4%. Recent Labs    11/30/19 1643 11/30/19 2003 12/01/19 0830  GLUCAP 192* 161* 99  -Discharged on home glimepiride.  Consider starting statin.  CKD-3a/mild azotemia: Cr 1.09 (02/2016)>> 1.08 (admit)> 1.20> 1.13.  Multifactorial. -Discontinued meloxicam and aspirin -Encourage Tylenol scheduled Tylenol for pain -Recheck renal function at follow-up.  Essential hypertension: Normotensive. -Continue home medications -Discourage NSAIDs.  Hypothyroidism -Continue home Synthroid  Lymphedema -Encourage leg elevation -Discharged on home Lasix.  GERD -Continue PPI.  Morbid obesity: BMI 50.24. -Encourage lifestyle change to lose weight. -Could benefit from GLP-1 inhibitors   Leukocytosis/bandemia: Likely due to steroid.  Improved.  Anxiety: Stable.  Not on medication.  Stage II pressure injury: POA Pressure Injury 11/28/19 Buttocks Right Stage 2 -  Partial thickness loss of dermis presenting as a shallow open injury with a red, pink wound bed without slough. Two dime size wounds in the folds of her right butt cheek. (Active)  11/28/19 0915  Location: Buttocks  Location Orientation: Right  Staging: Stage 2 -  Partial thickness  loss of dermis presenting as a shallow open injury with a red, pink wound bed without  slough.  Wound Description (Comments): Two dime size wounds in the folds of her right butt cheek.  Present on Admission:   -Discharge with [facility] RN.   Discharge Instructions  Discharge Instructions    Diet - low sodium heart healthy   Complete by: As directed    Diet Carb Modified   Complete by: As directed    Discharge instructions   Complete by: As directed    It has been a pleasure taking care of you! You were hospitalized and treated for COVID-19 infection and acute kidney injury.  Your symptoms and kidney function improved.  We stopped some of your home medications which could affect your kidney.  We recommend taking extra strength Tylenol around-the-clock for pain, which is safer to your kidney than the aspirin and meloxicam.  Please review your new medication list and the directions before you take your medications.  In regards to COVID-19 infection, you are potentially infectious for the next 10-15 days. We recommend you isolate yourself and take the necessary precautions to prevent the virus from spreading.  Some of the steps to prevent the virus from spreading to others: Stay away from other and members of your household at least for 10-15 days. Let healthy household members care for children and pets, if possible. If you have to care for children or pets, wash your hands often and wear a mask. If possible, stay in your own room, separate from others. Use a different bathroom.Make sure that all people in your household wash their hands well and often. Leave your house only to seek medical care. Do not use public transport. Do not travel while you are sick. Wash your hands often with soap and water for 20 seconds. If soap and water are not available, use alcohol-based hand sanitizer. Cough or sneeze into a tissue or your sleeve or elbow. Do not cough or sneeze into your hand or into the air. Wear a cloth face covering or face mask.  Return precautions: Get help or return to the  hospital right away if: You have trouble breathing. You have pain or pressure in your chest. You have confusion. You have bluish lips and fingernails. You have difficulty waking from sleep. You have symptoms that get worse. These symptoms may represent a serious problem that is an emergency. Do not wait to see if the symptoms will go away. Get medical help right away. Call your local emergency services (911 in the [place] Do not drive yourself to the hospital. Let the emergency medical personnel know if you think you have COVID-19.  To protect yourself in the future:  Do not travel to areas where COVID-19 is a risk. The areas where COVID-19 is reported change often. To identify high-risk areas and travel restrictions, check the CDC travel website: StageSync.si If you live in, or must travel to, an area where COVID-19 is a risk, take precautions to avoid infection. Stay away from people who are sick. Wash your hands often with soap and water for 20 seconds. If soap and water are not available, use an alcohol-based hand sanitizer. Avoid touching your mouth, face, eyes, or nose. Avoid going out in public, follow guidance from your state and local health authorities. If you must go out in public, wear a cloth face covering or face mask. Disinfect objects and surfaces that are frequently touched every day. This may include: Counters and tables. Doorknobs  and light switches. Sinks and faucets. Electronics, such as phones, remote controls, keyboards, computers, and tablets.    Where to find more information Centers for Disease Control and Prevention: StickerEmporium.tn World Health Organization: https://thompson-craig.com/   Increase activity slowly   Complete by: As directed    MyChart COVID-19 home monitoring program   Complete by: Dec 01, 2019    Is the patient willing to use the MyChart Mobile App for home monitoring?: No     Allergies  as of 12/01/2019      Reactions   Ciprofloxacin Hives   Codeine    Don't like the way it makes me feel.   Latex Itching, Rash, Other (See Comments)   Povidone-iodine Rash   Other reaction(s): Other (See Comments) unknown      Medication List    STOP taking these medications   aspirin 325 MG EC tablet   meloxicam 15 MG tablet Commonly known as: MOBIC     TAKE these medications   acetaminophen 500 MG tablet Commonly known as: TYLENOL Take 2 tablets (1,000 mg total) by mouth every 8 (eight) hours.   albuterol 108 (90 Base) MCG/ACT inhaler Commonly known as: VENTOLIN HFA Inhale 2 puffs into the lungs every 6 (six) hours as needed for wheezing or shortness of breath.   amLODipine 5 MG tablet Commonly known as: NORVASC Take 5 mg by mouth daily.   BLOOD GLUCOSE TEST STRIPS Strp 1 strip by Other route 3 (three) times a week.   carvedilol 6.25 MG tablet Commonly known as: COREG Take 12.5 mg by mouth at bedtime.   dextromethorphan-guaiFENesin 30-600 MG 12hr tablet Commonly known as: MUCINEX DM Take 1 tablet by mouth 2 (two) times daily.   ferrous sulfate 325 (65 FE) MG tablet Take 325 mg by mouth 2 (two) times daily.   furosemide 40 MG tablet Commonly known as: LASIX Take 40 mg by mouth daily.   glimepiride 1 MG tablet Commonly known as: AMARYL Take 1 mg by mouth daily.   levothyroxine 150 MCG tablet Commonly known as: SYNTHROID Take 150 mcg by mouth every evening.   lisinopril 20 MG tablet Commonly known as: ZESTRIL Take 40 mg by mouth every evening.   nystatin powder Commonly known as: MYCOSTATIN/NYSTOP Apply 1 application topically 3 (three) times daily as needed for rash. Apply to groin area tid prn for yeast in groin area.   omeprazole 40 MG capsule Commonly known as: PRILOSEC Take 40 mg by mouth daily.   triamcinolone cream 0.1 % Commonly known as: KENALOG Apply 1 application topically daily as needed.        Consultations:  None  Procedures/Studies:   CT ANGIO CHEST PE W OR WO CONTRAST  Result Date: 11/27/2019 CLINICAL DATA:  85 year old female with elevated D-dimer and shortness of breath. EXAM: CT ANGIOGRAPHY CHEST WITH CONTRAST TECHNIQUE: Multidetector CT imaging of the chest was performed using the standard protocol during bolus administration of intravenous contrast. Multiplanar CT image reconstructions and MIPs were obtained to evaluate the vascular anatomy. CONTRAST:  OMNIPAQUE IOHEXOL 350 MG/ML SOLN COMPARISON:  Chest radiograph dated 11/27/2019 and CT dated 11/04/2008. FINDINGS: Evaluation is limited due to streak artifact caused by patient's arms. Cardiovascular: There is mild cardiomegaly. No pericardial effusion. Coronary vascular calcifications noted. There is mild atherosclerotic calcification of the thoracic aorta. Mild dilatation of the main pulmonary trunk suggestive of a degree of pulmonary hypertension. Clinical correlation is recommended. Evaluation of the pulmonary arteries is very limited due to suboptimal opacification and timing  of the contrast as well as severe respiratory motion artifact. No large or central pulmonary artery embolus identified. Mediastinum/Nodes: No definite hilar or mediastinal adenopathy. Small hiatal hernia. No mediastinal fluid collection. Lungs/Pleura: Bilateral streaky airspace densities most consistent with multifocal pneumonia, likely viral or atypical in etiology. Clinical correlation is recommended. No pleural effusion or pneumothorax. The central airways are patent. Upper Abdomen: Thickened appearance of the right adrenal gland, new since the prior CT. There is a 12 mm nodular lesion in the lateral limb of the left adrenal gland similar to prior CT. Evaluation of the upper abdomen is very limited on this CT due to respiratory motion artifact. Cholecystectomy. Musculoskeletal: Stable appearing right paraspinal 1.5 x 2.0 cm nodular area (series 5,  image 143). Degenerative changes of the spine. No acute osseous pathology. Review of the MIP images confirms the above findings. IMPRESSION: 1. No CT evidence of central pulmonary artery embolus. 2. Multifocal pneumonia, likely viral or atypical in etiology. Clinical correlation and follow-up to resolution recommended. 3. Mild cardiomegaly with coronary vascular calcifications. 4. Aortic Atherosclerosis (ICD10-I70.0). Electronically Signed   By: Elgie CollardArash  Radparvar M.D.   On: 11/27/2019 22:17   DG Chest Port 1 View  Result Date: 11/27/2019 CLINICAL DATA:  Cough EXAM: PORTABLE CHEST 1 VIEW COMPARISON:  May 10, 2019 FINDINGS: There is ill-defined opacity in the medial right base. The lungs elsewhere are clear. Heart size and pulmonary vascularity are normal. No adenopathy. There are surgical clips in the thyroid region. IMPRESSION: Ill-defined opacity medial right base, likely due to developing pneumonia. Lungs elsewhere clear. Cardiac silhouette normal. Status post thyroidectomy. No adenopathy evident. Electronically Signed   By: Bretta BangWilliam  Woodruff III M.D.   On: 11/27/2019 14:35   VAS US LOWER EXTREMITY VENOUS (DVT)  Result Date: 11/28/2019  Lower Venous Study Indications: Elevated d dimer.  Limitations: Poor ultrasound/tissue interface, body habitus and pain tolerance. Comparison Study: no prior Performing Technologist: Blanch MediaMegan Riddle RVS  Examination Guidelines: A complete evaluation includes B-mode imaging, spectral Doppler, color Doppler, and power Doppler as needed of all accessible portions of each vessel. Bilateral testing is considered an integral part of a complete examination. Limited examinations for reoccurring indications may be performed as noted.  +---------+---------------+---------+-----------+----------+--------------+ RIGHT    CompressibilityPhasicitySpontaneityPropertiesThrombus Aging +---------+---------------+---------+-----------+----------+--------------+ CFV      Full            Yes      Yes                                 +---------+---------------+---------+-----------+----------+--------------+ SFJ      Full                                                        +---------+---------------+---------+-----------+----------+--------------+ FV Prox  Full                                                        +---------+---------------+---------+-----------+----------+--------------+ FV Mid                  Yes      Yes                                 +---------+---------------+---------+-----------+----------+--------------+  FV Distal                                             Not visualized +---------+---------------+---------+-----------+----------+--------------+ PFV      Full                                                        +---------+---------------+---------+-----------+----------+--------------+ POP      Full                                                        +---------+---------------+---------+-----------+----------+--------------+ PTV                                                   Not visualized +---------+---------------+---------+-----------+----------+--------------+ PERO                                                  Not visualized +---------+---------------+---------+-----------+----------+--------------+   +---------+---------------+---------+-----------+----------+--------------+ LEFT     CompressibilityPhasicitySpontaneityPropertiesThrombus Aging +---------+---------------+---------+-----------+----------+--------------+ CFV      Full           Yes      Yes                                 +---------+---------------+---------+-----------+----------+--------------+ SFJ      Full                                                        +---------+---------------+---------+-----------+----------+--------------+ FV Prox  Full                                                         +---------+---------------+---------+-----------+----------+--------------+ FV Mid                  Yes      Yes                                 +---------+---------------+---------+-----------+----------+--------------+ FV Distal                                             Not visualized +---------+---------------+---------+-----------+----------+--------------+ PFV      Full                                                        +---------+---------------+---------+-----------+----------+--------------+  POP      Full           Yes      Yes                                 +---------+---------------+---------+-----------+----------+--------------+ PTV                                                   Not visualized +---------+---------------+---------+-----------+----------+--------------+ PERO                                                  Not visualized +---------+---------------+---------+-----------+----------+--------------+     Summary: Right: There is no evidence of deep vein thrombosis in the lower extremity. However, portions of this examination were limited- see technologist comments above. No cystic structure found in the popliteal fossa. Left: There is no evidence of deep vein thrombosis in the lower extremity. However, portions of this examination were limited- see technologist comments above. No cystic structure found in the popliteal fossa.  *See table(s) above for measurements and observations. Electronically signed by Gretta Began MD on 11/28/2019 at 4:40:48 PM.    Final        Discharge Exam: Vitals:   11/30/19 2013 12/01/19 0428  BP: (!) 153/65 (!) 151/69  Pulse: 64 62  Resp: 18 16  Temp: 98.2 F (36.8 C) 97.8 F (36.6 C)  SpO2: 97% 97%    GENERAL: No acute distress.  Appears well.  HEENT: MMM.  Vision and hearing grossly intact.  NECK: Supple.  No apparent JVD.  RESP:  No IWOB.  Fair aeration bilaterally. CVS:  RRR. Heart sounds  normal.  ABD/GI/GU: Bowel sounds present. Soft. Non tender.  MSK/EXT:  Moves extremities. No apparent deformity.  No significant edema but stasis dermatitis bilaterally. SKIN: Stage II pressure skin injury over right buttock.  Stasis dermatitis bilaterally. NEURO: Awake, alert and oriented appropriately.  No apparent focal neuro deficit. PSYCH: Calm. Normal affect.   The results of significant diagnostics from this hospitalization (including imaging, microbiology, ancillary and laboratory) are listed below for reference.     Microbiology: Recent Results (from the past 240 hour(s))  Blood Culture (routine x 2)     Status: None (Preliminary result)   Collection Time: 11/27/19  1:06 PM   Specimen: BLOOD  Result Value Ref Range Status   Specimen Description   Final    BLOOD LEFT ANTECUBITAL Performed at [facility], [address] [place], [place] [address]    Special Requests   Final    BOTTLES DRAWN AEROBIC AND ANAEROBIC Blood Culture adequate volume Performed at [facility], [address], [place], [place] [address]    Culture   Final    NO GROWTH 4 DAYS Performed at [facility], [address], [place], [place] [address]    Report Status PENDING  Incomplete  SARS CORONAVIRUS 2 (TAT 6-24 HRS) Nasopharyngeal Nasopharyngeal Swab     Status: Abnormal   Collection Time: 11/27/19  2:47 PM   Specimen: Nasopharyngeal Swab  Result Value Ref Range Status   SARS Coronavirus 2 POSITIVE (A) NEGATIVE Final  Comment: RESULT CALLED TO, READ BACK BY AND VERIFIED WITH: Q MILNER,RN 2236 11/27/2019 D BRADLEY (NOTE) SARS-CoV-2 target nucleic acids are DETECTED. The SARS-CoV-2 RNA is generally detectable in upper and lower respiratory specimens during the acute phase of infection. Positive results are indicative of the presence of SARS-CoV-2 RNA. Clinical correlation with patient history and other diagnostic information is  necessary to determine patient  infection status. Positive results do not rule out bacterial infection or co-infection with other viruses.  The expected result is Negative. Fact Sheet for Patients: HairSlick.nohttps://www.fda.gov/media/138098/download Fact Sheet for Healthcare Providers: quierodirigir.comhttps://www.fda.gov/media/138095/download This test is not yet approved or cleared by the [place] FDA and  has been authorized for detection and/or diagnosis of SARS-CoV-2 by FDA under an Emergency Use Authorization (EUA). This EUA will remain  in effect (meaning this test can be used) for the  duration of the COVID-19 declaration under Section 564(b)(1) of the Act, 21 U.S.C. section 360bbb-3(b)(1), unless the authorization is terminated or revoked sooner. Performed at [facility], [address] [place], [place] [address]      Labs: BNP (last 3 results) Recent Labs    11/27/19 1351  BNP 50.5   Basic Metabolic Panel: Recent Labs  Lab 11/27/19 1351 11/28/19 0551 11/29/19 0250 11/30/19 0251 12/01/19 0307  NA 141 139 137 138 141  K 4.3 4.9 4.6 5.1 5.4*  CL 102 99 100 101 103  CO2 30 29 27 27 30   GLUCOSE 85 170* 154* 139* 119*  BUN 18 18 23  33* 39*  CREATININE 1.08* 1.12* 1.20* 1.13* 1.15*  CALCIUM 8.5* 8.5* 8.3* 8.3* 8.8*  MG  --  2.2 2.3 2.2 2.4  PHOS  --  2.8 2.6 3.7 3.6   Liver Function Tests: Recent Labs  Lab 11/27/19 1351 11/28/19 0551 11/29/19 0250 11/30/19 0251 12/01/19 0307  AST 36 34 26 35 47*  ALT 18 18 15 16 28   ALKPHOS 217* 217* 182* 191* 214*  BILITOT 0.7 0.7 0.6 0.8 0.4  PROT 7.2 7.0 6.5 6.7 7.0  ALBUMIN 3.0* 3.0* 2.7* 2.8* 2.9*   No results for input(s): LIPASE, AMYLASE in the last 168 hours. No results for input(s): AMMONIA in the last 168 hours. CBC: Recent Labs  Lab 11/27/19 1351 11/28/19 0551 11/29/19 0250 11/30/19 0251 12/01/19 0307  WBC 12.9* 8.5 11.1* 13.6* 11.7*  NEUTROABS 9.1* 7.1 8.5* 10.6* 8.6*  HGB 12.4 12.4 11.6* 12.4 13.3  HCT 40.0 40.0 37.1 40.2 43.3  MCV 96.4  95.2 94.2 96.6 97.5  PLT 185 190 209 204 236   Cardiac Enzymes: No results for input(s): CKTOTAL, CKMB, CKMBINDEX, TROPONINI in the last 168 hours. BNP: Invalid input(s): POCBNP CBG: Recent Labs  Lab 11/30/19 0806 11/30/19 1125 11/30/19 1643 11/30/19 2003 12/01/19 0830  GLUCAP 104* 122* 192* 161* 99   D-Dimer Recent Labs    11/30/19 0251 12/01/19 0307  DDIMER 3.46* 3.05*   Hgb A1c No results for input(s): HGBA1C in the last 72 hours. Lipid Profile No results for input(s): CHOL, HDL, LDLCALC, TRIG, CHOLHDL, LDLDIRECT in the last 72 hours. Thyroid function studies No results for input(s): TSH, T4TOTAL, T3FREE, THYROIDAB in the last 72 hours.  Invalid input(s): FREET3 Anemia work up Recent Labs    11/30/19 0251 12/01/19 0307  FERRITIN 39 50   Urinalysis    Component Value Date/Time   COLORURINE YELLOW 06/11/2008 1105   APPEARANCEUR CLEAR 06/11/2008 1105   LABSPEC 1.015 06/11/2008 1105   PHURINE 6.5 06/11/2008 1105   GLUCOSEU  NEGATIVE 06/11/2008 1105   HGBUR NEGATIVE 06/11/2008 1105   BILIRUBINUR NEGATIVE 06/11/2008 1105   KETONESUR NEGATIVE 06/11/2008 1105   PROTEINUR NEGATIVE 06/11/2008 1105   UROBILINOGEN 0.2 06/11/2008 1105   NITRITE NEGATIVE 06/11/2008 1105   LEUKOCYTESUR  06/11/2008 1105    NEGATIVE MICROSCOPIC NOT DONE ON URINES WITH NEGATIVE PROTEIN, BLOOD, LEUKOCYTES, NITRITE, OR GLUCOSE <1000 mg/dL.   Sepsis Labs Invalid input(s): PROCALCITONIN,  WBC,  LACTICIDVEN   Time coordinating discharge: 35 minutes  SIGNED:  Almon Hercules, MD  [facility] 12/01/2019, 4:42 PM  If 7PM-7AM, please contact night-coverage [email or web address] Password [email or web address]

## 2019-12-01 NOTE — Progress Notes (Signed)
Pt ambulated in room on RA maintaining sats at around 92% on RA; She would dip to around 89-90% with exertion but quickly came up to 92% with a couple of deep breaths. Dr Alanda Slim advised on results of ambulation test; Patient was provided dc paper work and instructions explained in detail. Patient verbalized understanding and escorted to lobby in wheel chair to be discharged home with family members and spouse

## 2019-12-02 LAB — CULTURE, BLOOD (ROUTINE X 2)
Culture: NO GROWTH
Special Requests: ADEQUATE

## 2020-01-15 ENCOUNTER — Encounter (HOSPITAL_COMMUNITY): Payer: Self-pay | Admitting: Emergency Medicine

## 2020-01-15 ENCOUNTER — Emergency Department (HOSPITAL_COMMUNITY)
Admission: EM | Admit: 2020-01-15 | Discharge: 2020-01-15 | Disposition: A | Discharge status: Home / Self Care | Payer: Medicare HMO | Attending: Emergency Medicine | Admitting: Emergency Medicine

## 2020-01-15 ENCOUNTER — Emergency Department (HOSPITAL_COMMUNITY): Payer: Medicare HMO

## 2020-01-15 ENCOUNTER — Other Ambulatory Visit: Payer: Self-pay

## 2020-01-15 DIAGNOSIS — R0602 Shortness of breath: Secondary | ICD-10-CM | POA: Diagnosis present

## 2020-01-15 DIAGNOSIS — Z8616 Personal history of COVID-19: Secondary | ICD-10-CM | POA: Insufficient documentation

## 2020-01-15 DIAGNOSIS — J40 Bronchitis, not specified as acute or chronic: Secondary | ICD-10-CM | POA: Diagnosis not present

## 2020-01-15 DIAGNOSIS — Z91040 Latex allergy status: Secondary | ICD-10-CM | POA: Diagnosis not present

## 2020-01-15 DIAGNOSIS — Z7984 Long term (current) use of oral hypoglycemic drugs: Secondary | ICD-10-CM | POA: Insufficient documentation

## 2020-01-15 DIAGNOSIS — E119 Type 2 diabetes mellitus without complications: Secondary | ICD-10-CM | POA: Insufficient documentation

## 2020-01-15 DIAGNOSIS — Z79899 Other long term (current) drug therapy: Secondary | ICD-10-CM | POA: Diagnosis not present

## 2020-01-15 DIAGNOSIS — Z9049 Acquired absence of other specified parts of digestive tract: Secondary | ICD-10-CM | POA: Insufficient documentation

## 2020-01-15 DIAGNOSIS — I10 Essential (primary) hypertension: Secondary | ICD-10-CM | POA: Insufficient documentation

## 2020-01-15 DIAGNOSIS — E039 Hypothyroidism, unspecified: Secondary | ICD-10-CM | POA: Diagnosis not present

## 2020-01-15 LAB — CBC
HCT: 37.2 % (ref 36.0–46.0)
Hemoglobin: 11.3 g/dL — ABNORMAL LOW (ref 12.0–15.0)
MCH: 31.5 pg (ref 26.0–34.0)
MCHC: 30.4 g/dL (ref 30.0–36.0)
MCV: 103.6 fL — ABNORMAL HIGH (ref 80.0–100.0)
Platelets: 189 10*3/uL (ref 150–400)
RBC: 3.59 MIL/uL — ABNORMAL LOW (ref 3.87–5.11)
RDW: 15 % (ref 11.5–15.5)
WBC: 8.8 10*3/uL (ref 4.0–10.5)
nRBC: 0 % (ref 0.0–0.2)

## 2020-01-15 LAB — COMPREHENSIVE METABOLIC PANEL
ALT: 40 U/L (ref 0–44)
AST: 51 U/L — ABNORMAL HIGH (ref 15–41)
Albumin: 3 g/dL — ABNORMAL LOW (ref 3.5–5.0)
Alkaline Phosphatase: 296 U/L — ABNORMAL HIGH (ref 38–126)
Anion gap: 8 (ref 5–15)
BUN: 31 mg/dL — ABNORMAL HIGH (ref 8–23)
CO2: 32 mmol/L (ref 22–32)
Calcium: 8.6 mg/dL — ABNORMAL LOW (ref 8.9–10.3)
Chloride: 100 mmol/L (ref 98–111)
Creatinine, Ser: 1.17 mg/dL — ABNORMAL HIGH (ref 0.44–1.00)
GFR calc Af Amer: 50 mL/min — ABNORMAL LOW (ref >60)
GFR calc non Af Amer: 43 mL/min — ABNORMAL LOW (ref >60)
Glucose, Bld: 99 mg/dL (ref 70–99)
Potassium: 4.7 mmol/L (ref 3.5–5.1)
Sodium: 140 mmol/L (ref 135–145)
Total Bilirubin: 0.7 mg/dL (ref 0.3–1.2)
Total Protein: 7.3 g/dL (ref 6.5–8.1)

## 2020-01-15 LAB — BRAIN NATRIURETIC PEPTIDE: B Natriuretic Peptide: 100.2 pg/mL — ABNORMAL HIGH (ref 0.0–100.0)

## 2020-01-15 MED ORDER — ALBUTEROL SULFATE HFA 108 (90 BASE) MCG/ACT IN AERS
4.0000 | INHALATION_SPRAY | Freq: Once | RESPIRATORY_TRACT | Status: AC
  Administered 2020-01-15: 4 via RESPIRATORY_TRACT
  Filled 2020-01-15: qty 6.7

## 2020-01-15 MED ORDER — ALBUTEROL SULFATE HFA 108 (90 BASE) MCG/ACT IN AERS
4.0000 | INHALATION_SPRAY | Freq: Once | RESPIRATORY_TRACT | Status: AC
  Administered 2020-01-15: 4 via RESPIRATORY_TRACT

## 2020-01-15 MED ORDER — METHYLPREDNISOLONE SODIUM SUCC 125 MG IJ SOLR
125.0000 mg | Freq: Once | INTRAMUSCULAR | Status: AC
  Administered 2020-01-15: 125 mg via INTRAVENOUS
  Filled 2020-01-15: qty 2

## 2020-01-15 MED ORDER — PREDNISONE 50 MG PO TABS: 50.0000 mg | ORAL_TABLET | Freq: Every day | ORAL | 0 refills | Status: DC

## 2020-01-15 NOTE — ED Triage Notes (Signed)
Arrives via EMS from doctor's office, C/C SOB and bilateral upper and lower extremity edema x2 days, on home oxygen as needed, has not been wearing her oxygen, has not been taking lasix as prescribed either. Wheezing noted with EMS, saturation in the mid 80s with doctor's office, no acute distress.

## 2020-01-15 NOTE — Discharge Instructions (Addendum)
Continue using inhaler as we discussed.  Take 2 puffs every 4- 6 hours as needed for wheezing.  Take the steroids as prescribed.  Continue your nasal cannula oxygen.  Follow-up with your primary care doctor.

## 2020-01-15 NOTE — ED Notes (Signed)
Pt ambulated from bed to door with assist device.  Per pt she always uses and assist device.   Pt's O2 was at 93% on 2L and heart rate was at 79.  Pt showed no signs of distress.  Pt stated she felt good

## 2020-01-15 NOTE — ED Provider Notes (Signed)
[facility]-EMERGENCY DEPT Provider Note   CSN: [id number] Arrival date & time: 01/15/20  1507     History Chief Complaint  Patient presents with  . Shortness of Breath    Brandy Morris is a 85 y.o. female.  HPI   Patient presents to the emergency room for evaluation of shortness of breath.  Patient was admitted to the hospital last month from January 5 to January 9.  Patient was diagnosed with Covid pneumonia.  She was treated with remdesivir and Decadron.  Her husband had also been admitted for the same condition.  Skilled nursing facility treatment was recommended but the patient opted to go home with home health.  Patient was noted to have oxygen requirements so she was started on supplemental oxygen at home.  She has been using 2 L nasal cannula.  Patient felt like in the last few days she was having increasing difficulty breathing.  She was wheezing.  She has noticed persistent leg swelling.  She was seen at her doctor's office today and was sent to the ED for further evaluation.  Past Medical History:  Diagnosis Date  . Diabetes mellitus without complication (HCC)   . GERD (gastroesophageal reflux disease)   . Hypertension     Patient Active Problem List   Diagnosis Date Noted  . Pneumonia due to COVID-19 virus 11/28/2019  . Pressure injury of skin 11/28/2019  . Acute respiratory failure (HCC) 11/27/2019  . GERD (gastroesophageal reflux disease) 11/27/2019  . Hypothyroidism 11/27/2019  . Community acquired pneumonia 11/27/2019  . Diabetes mellitus (HCC) 11/27/2019  . Essential hypertension 11/27/2019  . COVID-19 virus infection 11/27/2019    Past Surgical History:  Procedure Laterality Date  . ABDOMINAL HYSTERECTOMY    . Bladder Tack    . CHOLECYSTECTOMY    . JOINT REPLACEMENT    . THYROIDECTOMY    . TUBAL LIGATION       OB History   No obstetric history on file.     Family History  Problem Relation Age of Onset  . Pneumonia  Mother     Social History   Tobacco Use  . Smoking status: Never Smoker  . Smokeless tobacco: Never Used  Substance Use Topics  . Alcohol use: No  . Drug use: No    Home Medications Prior to Admission medications   Medication Sig Start Date End Date Taking? Authorizing Provider  acetaminophen (TYLENOL) 500 MG tablet Take 2 tablets (1,000 mg total) by mouth every 8 (eight) hours. Patient taking differently: Take 1,000 mg by mouth every 6 (six) hours as needed for moderate pain.  12/01/19 01/20/20 Yes Almon Hercules, MD  albuterol (VENTOLIN HFA) 108 (90 Base) MCG/ACT inhaler Inhale 2 puffs into the lungs every 6 (six) hours as needed for wheezing or shortness of breath. 12/01/19  Yes Almon Hercules, MD  amLODipine (NORVASC) 5 MG tablet Take 5 mg by mouth every evening.  03/14/16  Yes [provider]  carvedilol (COREG) 6.25 MG tablet Take 12.5 mg by mouth at bedtime. 10/30/19  Yes [provider]  dextromethorphan-guaiFENesin (MUCINEX DM) 30-600 MG 12hr tablet Take 1 tablet by mouth 2 (two) times daily. 12/01/19  Yes Almon Hercules, MD  furosemide (LASIX) 40 MG tablet Take 40 mg by mouth daily. 11/25/19  Yes [provider]  glimepiride (AMARYL) 1 MG tablet Take 1 mg by mouth daily.   Yes [provider]  levothyroxine (SYNTHROID) 150 MCG tablet Take 150 mcg by mouth  every evening.   Yes [provider]  lisinopril (ZESTRIL) 20 MG tablet Take 40 mg by mouth every evening. 09/14/19 09/13/20 Yes [provider]  nystatin (MYCOSTATIN/NYSTOP) powder Apply 1 application topically 3 (three) times daily as needed for rash. Apply to groin area tid prn for yeast in groin area. 03/24/18  Yes [provider]  omeprazole (PRILOSEC) 20 MG capsule Take 20 mg by mouth daily. 12/06/19  Yes [provider]  triamcinolone cream (KENALOG) 0.1 % Apply 1 application topically daily as needed (rash).  10/16/19  Yes [provider]  Glucose Blood  (BLOOD GLUCOSE TEST STRIPS) STRP 1 strip by Other route 3 (three) times a week. 01/26/19   [provider]  predniSONE (DELTASONE) 50 MG tablet Take 1 tablet (50 mg total) by mouth daily. 01/15/20   Linwood Dibbles, MD    Allergies    Ciprofloxacin, Codeine, Latex, and Povidone-iodine  Review of Systems   Review of Systems  Constitutional: Negative for fever.  Respiratory: Positive for shortness of breath. Negative for chest tightness.   Cardiovascular: Negative for chest pain.  All other systems reviewed and are negative.   Physical Exam Updated Vital Signs BP (!) 144/67   Pulse 73   Temp 97.9 F (36.6 C) (Oral)   Resp 20   SpO2 96%   Physical Exam Vitals and nursing note reviewed.  Constitutional:      Appearance: She is obese. She is not toxic-appearing or diaphoretic.  HENT:     Head: Normocephalic and atraumatic.     Right Ear: External ear normal.     Left Ear: External ear normal.  Eyes:     General: No scleral icterus.       Right eye: No discharge.        Left eye: No discharge.     Conjunctiva/sclera: Conjunctivae normal.  Neck:     Trachea: No tracheal deviation.  Cardiovascular:     Rate and Rhythm: Normal rate and regular rhythm.  Pulmonary:     Effort: Pulmonary effort is normal. No respiratory distress.     Breath sounds: No stridor. Wheezing present. No rales.  Abdominal:     General: Bowel sounds are normal. There is no distension.     Palpations: Abdomen is soft.     Tenderness: There is no abdominal tenderness. There is no guarding or rebound.  Musculoskeletal:        General: No tenderness.     Cervical back: Neck supple.     Right lower leg: Edema present.     Left lower leg: Edema present.  Skin:    General: Skin is warm and dry.     Findings: No rash.  Neurological:     Mental Status: She is alert.     Cranial Nerves: No cranial nerve deficit (no facial droop, extraocular movements intact, no slurred speech).     Sensory: No sensory  deficit.     Motor: No abnormal muscle tone or seizure activity.     Coordination: Coordination normal.     ED Results / Procedures / Treatments   Labs (all labs ordered are listed, but only abnormal results are displayed) Labs Reviewed  CBC - Abnormal; Notable for the following components:      Result Value   RBC 3.59 (*)    Hemoglobin 11.3 (*)    MCV 103.6 (*)    All other components within normal limits  BRAIN NATRIURETIC PEPTIDE - Abnormal; Notable for the following  components:   B Natriuretic Peptide 100.2 (*)    All other components within normal limits  COMPREHENSIVE METABOLIC PANEL - Abnormal; Notable for the following components:   BUN 31 (*)    Creatinine, Ser 1.17 (*)    Calcium 8.6 (*)    Albumin 3.0 (*)    AST 51 (*)    Alkaline Phosphatase 296 (*)    GFR calc non Af Amer 43 (*)    GFR calc Af Amer 50 (*)    All other components within normal limits    EKG EKG Interpretation  Date/Time:  Tuesday January 15 2020 16:07:10 EST Ventricular Rate:  61 PR Interval:    QRS Duration: 112 QT Interval:  429 QTC Calculation: 433 R Axis:   87 Text Interpretation: Sinus rhythm Atrial premature complexes Borderline intraventricular conduction delay Low voltage, precordial leads Abnormal inferior Q waves No significant change since last tracing Confirmed by Dorie Rank [phone]) on 01/15/2020 4:15:31 PM   Radiology DG Chest Port 1 View  Result Date: 01/15/2020 CLINICAL DATA:  Dyspnea EXAM: PORTABLE CHEST 1 VIEW COMPARISON:  November 27, 2019 FINDINGS: The heart size and mediastinal contours are unremarkable. Mildly hazy/patchy airspace opacity seen at the left lung base and medial right lung base, slightly improved since the prior exam. Aortic knob calcifications. Surgical clips overlying the upper neck. The visualized skeletal structures are unremarkable. IMPRESSION: Mildly hazy airspace opacity at the left lung base and right medial lung base, slightly improved since the  prior exam which could be resolving infectious etiology or atelectasis Electronically Signed   By: Prudencio Pair M.D.   On: 01/15/2020 17:27    Procedures Procedures (including critical care time)  Medications Ordered in ED Medications  albuterol (VENTOLIN HFA) 108 (90 Base) MCG/ACT inhaler 4 puff (4 puffs Inhalation Given 01/15/20 1615)  methylPREDNISolone sodium succinate (SOLU-MEDROL) 125 mg/2 mL injection 125 mg (125 mg Intravenous Given 01/15/20 1644)  albuterol (VENTOLIN HFA) 108 (90 Base) MCG/ACT inhaler 4 puff (4 puffs Inhalation Given 01/15/20 1804)    ED Course  I have reviewed the triage vital signs and the nursing notes.  Pertinent labs & imaging results that were available during my care of the patient were reviewed by me and considered in my medical decision making (see chart for details).  Clinical Course as of Jan 15 1924  Tue Jan 15, 2020  1728 Anemia noted.  Slight decrease from previous   [JK]  1728 Renal function stable.   [WF]  0932 Pt is feeling better.  Still with some wheeze on exam.  Will repeat dose.   [JK]  3557 CXR shows improvement.   [JK]    Clinical Course User Index [JK] Dorie Rank, MD   MDM Rules/Calculators/A&P                      Pt has bronchospasm on exam.  NO sign of worsening pna.  Labs unremarkable.  Pt treated with alb and steroids.  Feeling better but still with a wheeze.  Pt is on [place] oxygen 2l. She has this available at home.  Will try additional neb and have her try to ambulate, make sure she feels comfortable.  Peripheral edema noted but no chf.  Pt has not been compliant with her oral home lasix.  Pt was able to ambulate around the ED.  Oxygen remained stable.  Will have her continue the 2l [place] Final Clinical Impression(s) / ED Diagnoses Final diagnoses:  Bronchitis  Rx / DC Orders ED Discharge Orders         Ordered    predniSONE (DELTASONE) 50 MG tablet  Daily     01/15/20 1923           Linwood Dibbles, MD 01/15/20  1925

## 2020-03-17 ENCOUNTER — Other Ambulatory Visit: Payer: Self-pay

## 2020-03-17 ENCOUNTER — Encounter (HOSPITAL_COMMUNITY): Payer: Self-pay | Admitting: Obstetrics and Gynecology

## 2020-03-17 ENCOUNTER — Emergency Department (HOSPITAL_COMMUNITY): Payer: Medicare HMO

## 2020-03-17 ENCOUNTER — Observation Stay (HOSPITAL_COMMUNITY)
Admission: EM | Admit: 2020-03-17 | Discharge: 2020-03-20 | Disposition: A | Discharge status: Skilled Nursing Facility | Payer: Medicare HMO | Attending: Internal Medicine | Admitting: Internal Medicine

## 2020-03-17 DIAGNOSIS — K760 Fatty (change of) liver, not elsewhere classified: Secondary | ICD-10-CM | POA: Insufficient documentation

## 2020-03-17 DIAGNOSIS — N1831 Chronic kidney disease, stage 3a: Secondary | ICD-10-CM | POA: Diagnosis not present

## 2020-03-17 DIAGNOSIS — R918 Other nonspecific abnormal finding of lung field: Secondary | ICD-10-CM | POA: Diagnosis not present

## 2020-03-17 DIAGNOSIS — N179 Acute kidney failure, unspecified: Secondary | ICD-10-CM | POA: Diagnosis not present

## 2020-03-17 DIAGNOSIS — K449 Diaphragmatic hernia without obstruction or gangrene: Secondary | ICD-10-CM | POA: Diagnosis not present

## 2020-03-17 DIAGNOSIS — I509 Heart failure, unspecified: Secondary | ICD-10-CM | POA: Insufficient documentation

## 2020-03-17 DIAGNOSIS — R609 Edema, unspecified: Secondary | ICD-10-CM

## 2020-03-17 DIAGNOSIS — I10 Essential (primary) hypertension: Secondary | ICD-10-CM | POA: Diagnosis present

## 2020-03-17 DIAGNOSIS — Z6841 Body Mass Index (BMI) 40.0 and over, adult: Secondary | ICD-10-CM | POA: Insufficient documentation

## 2020-03-17 DIAGNOSIS — Z79890 Hormone replacement therapy (postmenopausal): Secondary | ICD-10-CM | POA: Insufficient documentation

## 2020-03-17 DIAGNOSIS — I890 Lymphedema, not elsewhere classified: Secondary | ICD-10-CM | POA: Diagnosis not present

## 2020-03-17 DIAGNOSIS — I130 Hypertensive heart and chronic kidney disease with heart failure and stage 1 through stage 4 chronic kidney disease, or unspecified chronic kidney disease: Secondary | ICD-10-CM | POA: Insufficient documentation

## 2020-03-17 DIAGNOSIS — K219 Gastro-esophageal reflux disease without esophagitis: Secondary | ICD-10-CM | POA: Diagnosis present

## 2020-03-17 DIAGNOSIS — L89316 Pressure-induced deep tissue damage of right buttock: Secondary | ICD-10-CM | POA: Insufficient documentation

## 2020-03-17 DIAGNOSIS — Z7952 Long term (current) use of systemic steroids: Secondary | ICD-10-CM | POA: Diagnosis not present

## 2020-03-17 DIAGNOSIS — I872 Venous insufficiency (chronic) (peripheral): Secondary | ICD-10-CM | POA: Diagnosis not present

## 2020-03-17 DIAGNOSIS — E1122 Type 2 diabetes mellitus with diabetic chronic kidney disease: Secondary | ICD-10-CM | POA: Diagnosis not present

## 2020-03-17 DIAGNOSIS — M5136 Other intervertebral disc degeneration, lumbar region: Principal | ICD-10-CM | POA: Insufficient documentation

## 2020-03-17 DIAGNOSIS — Z791 Long term (current) use of non-steroidal anti-inflammatories (NSAID): Secondary | ICD-10-CM | POA: Insufficient documentation

## 2020-03-17 DIAGNOSIS — Z20822 Contact with and (suspected) exposure to covid-19: Secondary | ICD-10-CM | POA: Diagnosis not present

## 2020-03-17 DIAGNOSIS — E039 Hypothyroidism, unspecified: Secondary | ICD-10-CM | POA: Diagnosis present

## 2020-03-17 DIAGNOSIS — Z885 Allergy status to narcotic agent status: Secondary | ICD-10-CM | POA: Insufficient documentation

## 2020-03-17 DIAGNOSIS — M549 Dorsalgia, unspecified: Secondary | ICD-10-CM | POA: Diagnosis present

## 2020-03-17 DIAGNOSIS — M48061 Spinal stenosis, lumbar region without neurogenic claudication: Secondary | ICD-10-CM | POA: Diagnosis not present

## 2020-03-17 DIAGNOSIS — I700 Atherosclerosis of aorta: Secondary | ICD-10-CM | POA: Insufficient documentation

## 2020-03-17 DIAGNOSIS — Z8616 Personal history of COVID-19: Secondary | ICD-10-CM | POA: Diagnosis not present

## 2020-03-17 DIAGNOSIS — Z888 Allergy status to other drugs, medicaments and biological substances status: Secondary | ICD-10-CM | POA: Insufficient documentation

## 2020-03-17 DIAGNOSIS — M545 Low back pain, unspecified: Secondary | ICD-10-CM

## 2020-03-17 DIAGNOSIS — Z881 Allergy status to other antibiotic agents status: Secondary | ICD-10-CM | POA: Insufficient documentation

## 2020-03-17 DIAGNOSIS — R945 Abnormal results of liver function studies: Secondary | ICD-10-CM

## 2020-03-17 DIAGNOSIS — R7989 Other specified abnormal findings of blood chemistry: Secondary | ICD-10-CM

## 2020-03-17 DIAGNOSIS — Z91040 Latex allergy status: Secondary | ICD-10-CM | POA: Insufficient documentation

## 2020-03-17 DIAGNOSIS — Z79899 Other long term (current) drug therapy: Secondary | ICD-10-CM | POA: Insufficient documentation

## 2020-03-17 LAB — CBC WITH DIFFERENTIAL/PLATELET
Abs Immature Granulocytes: 0.05 10*3/uL (ref 0.00–0.07)
Basophils Absolute: 0.1 10*3/uL (ref 0.0–0.1)
Basophils Relative: 1 %
Eosinophils Absolute: 0.8 10*3/uL — ABNORMAL HIGH (ref 0.0–0.5)
Eosinophils Relative: 7 %
HCT: 38.7 % (ref 36.0–46.0)
Hemoglobin: 12 g/dL (ref 12.0–15.0)
Immature Granulocytes: 0 %
Lymphocytes Relative: 24 %
Lymphs Abs: 2.7 10*3/uL (ref 0.7–4.0)
MCH: 30.7 pg (ref 26.0–34.0)
MCHC: 31 g/dL (ref 30.0–36.0)
MCV: 99 fL (ref 80.0–100.0)
Monocytes Absolute: 1 10*3/uL (ref 0.1–1.0)
Monocytes Relative: 9 %
Neutro Abs: 6.7 10*3/uL (ref 1.7–7.7)
Neutrophils Relative %: 59 %
Platelets: 244 10*3/uL (ref 150–400)
RBC: 3.91 MIL/uL (ref 3.87–5.11)
RDW: 13.2 % (ref 11.5–15.5)
WBC: 11.2 10*3/uL — ABNORMAL HIGH (ref 4.0–10.5)
nRBC: 0 % (ref 0.0–0.2)

## 2020-03-17 MED ORDER — METHOCARBAMOL 1000 MG/10ML IJ SOLN
500.0000 mg | Freq: Once | INTRAVENOUS | Status: AC
  Administered 2020-03-18: 500 mg via INTRAVENOUS
  Filled 2020-03-17: qty 500

## 2020-03-17 MED ORDER — FENTANYL CITRATE (PF) 100 MCG/2ML IJ SOLN
50.0000 ug | Freq: Once | INTRAMUSCULAR | Status: AC
  Administered 2020-03-17: 50 ug via INTRAVENOUS
  Filled 2020-03-17: qty 2

## 2020-03-17 MED ORDER — METHOCARBAMOL 1000 MG/10ML IJ SOLN: 500.0000 mg | Freq: Once | INTRAMUSCULAR | Status: DC

## 2020-03-17 MED ORDER — FUROSEMIDE 10 MG/ML IJ SOLN
60.0000 mg | Freq: Once | INTRAMUSCULAR | Status: AC
  Administered 2020-03-17: 60 mg via INTRAVENOUS
  Filled 2020-03-17: qty 8

## 2020-03-17 NOTE — ED Provider Notes (Signed)
[facility] DEPT Provider Note   CSN: [id number] Arrival date & time: 03/17/20  1412     History Chief Complaint  Patient presents with  . Back Pain    Brandy Morris is a 85 y.o. female.  Patient with history of HTN, DM, GERD, COVID (Jan 2021), dependent edema presents with back pain that started 2 weeks ago. Pain starts at the left parascapular back and extends to bilateral lumbar area. It is worse with movement, better with rest. She has been taking meloxicam without relief. She denies chronic back pain. She also reports having post-prandial nausea without vomiting and generalized abdominal pain for the past 3 weeks. She was constipated until 3 days ago and reports having several bowel movements since but that did not change the other abdominal symptoms. She states she just finished antibiotics for a UTI and is no longer having any of the dysuria she had with her infection. No fever, SOB, cough.   The history is provided by the patient. No language interpreter was used.  Back Pain Associated symptoms: abdominal pain   Associated symptoms: no chest pain, no dysuria, no fever and no weakness        Past Medical History:  Diagnosis Date  . Diabetes mellitus without complication ([place])   . GERD (gastroesophageal reflux disease)   . Hypertension     Patient Active Problem List   Diagnosis Date Noted  . Pneumonia due to COVID-19 virus 11/28/2019  . Pressure injury of skin 11/28/2019  . Acute respiratory failure ([place]) 11/27/2019  . GERD (gastroesophageal reflux disease) 11/27/2019  . Hypothyroidism 11/27/2019  . Community acquired pneumonia 11/27/2019  . Diabetes mellitus ([place]) 11/27/2019  . Essential hypertension 11/27/2019  . COVID-19 virus infection 11/27/2019    Past Surgical History:  Procedure Laterality Date  . ABDOMINAL HYSTERECTOMY    . Bladder Tack    . CHOLECYSTECTOMY    . JOINT REPLACEMENT    . THYROIDECTOMY    . TUBAL  LIGATION       OB History   No obstetric history on file.     Family History  Problem Relation Age of Onset  . Pneumonia Mother     Social History   Tobacco Use  . Smoking status: Never Smoker  . Smokeless tobacco: Never Used  Substance Use Topics  . Alcohol use: No  . Drug use: No    Home Medications Prior to Admission medications   Medication Sig Start Date End Date Taking? Authorizing Provider  albuterol (VENTOLIN HFA) 108 (90 Base) MCG/ACT inhaler Inhale 2 puffs into the lungs every 6 (six) hours as needed for wheezing or shortness of breath. 12/01/19   Mercy Riding, MD  amLODipine (NORVASC) 5 MG tablet Take 5 mg by mouth every evening.  03/14/16   [provider]  carvedilol (COREG) 6.25 MG tablet Take 12.5 mg by mouth at bedtime. 10/30/19   [provider]  dextromethorphan-guaiFENesin (MUCINEX DM) 30-600 MG 12hr tablet Take 1 tablet by mouth 2 (two) times daily. 12/01/19   Mercy Riding, MD  furosemide (LASIX) 40 MG tablet Take 40 mg by mouth daily. 11/25/19   [provider]  glimepiride (AMARYL) 1 MG tablet Take 1 mg by mouth daily.    [provider]  Glucose Blood (BLOOD GLUCOSE TEST STRIPS) STRP 1 strip by Other route 3 (three) times a week. 01/26/19   [provider]  levothyroxine (SYNTHROID) 150 MCG tablet Take 150 mcg by mouth every  evening.    [provider]  lisinopril (ZESTRIL) 20 MG tablet Take 40 mg by mouth every evening. 09/14/19 09/13/20  [provider]  nystatin (MYCOSTATIN/NYSTOP) powder Apply 1 application topically 3 (three) times daily as needed for rash. Apply to groin area tid prn for yeast in groin area. 03/24/18   [provider]  omeprazole (PRILOSEC) 20 MG capsule Take 20 mg by mouth daily. 12/06/19   [provider]  predniSONE (DELTASONE) 50 MG tablet Take 1 tablet (50 mg total) by mouth daily. 01/15/20   Linwood Dibbles, MD  triamcinolone cream (KENALOG) 0.1 % Apply 1  application topically daily as needed (rash).  10/16/19   [provider]    Allergies    Ciprofloxacin, Codeine, Latex, and Povidone-iodine  Review of Systems   Review of Systems  Constitutional: Negative for chills and fever.  HENT: Negative.   Respiratory: Negative.   Cardiovascular: Positive for leg swelling. Negative for chest pain.  Gastrointestinal: Positive for abdominal pain and nausea. Negative for vomiting.  Genitourinary: Negative.  Negative for dysuria.  Musculoskeletal: Positive for back pain.  Skin: Negative.   Neurological: Negative.  Negative for weakness.    Physical Exam Updated Vital Signs BP (!) 161/75 (BP Location: Left Arm)   Pulse 72   Temp (!) 97.5 F (36.4 C) (Oral)   Resp 17   SpO2 96%   Physical Exam Vitals and nursing note reviewed.  Constitutional:      Appearance: She is well-developed. She is obese. She is not ill-appearing.  HENT:     Head: Normocephalic.  Cardiovascular:     Rate and Rhythm: Normal rate and regular rhythm.     Heart sounds: No murmur.  Pulmonary:     Effort: Pulmonary effort is normal.     Breath sounds: Normal breath sounds. No wheezing, rhonchi or rales.  Abdominal:     Palpations: Abdomen is soft.     Tenderness: There is no abdominal tenderness. There is no guarding or rebound.  Musculoskeletal:        General: Normal range of motion.     Cervical back: Normal range of motion and neck supple.     Right lower leg: Edema present.     Left lower leg: Edema present.     Comments: Significant bilateral LE edema with venous stasis blistering. There is minimal tenderness of the left and lower back.   Skin:    General: Skin is warm and dry.  Neurological:     Mental Status: She is alert and oriented to person, place, and time.     ED Results / Procedures / Treatments   Labs (all labs ordered are listed, but only abnormal results are displayed) Labs Reviewed  URINE CULTURE  CBC WITH  DIFFERENTIAL/PLATELET  BRAIN NATRIURETIC PEPTIDE  COMPREHENSIVE METABOLIC PANEL  URINALYSIS, ROUTINE W REFLEX MICROSCOPIC   Results for orders placed or performed during the hospital encounter of 03/17/20  CBC with Differential  Result Value Ref Range   WBC 11.2 (H) 4.0 - 10.5 K/uL   RBC 3.91 3.87 - 5.11 MIL/uL   Hemoglobin 12.0 12.0 - 15.0 g/dL   HCT 56.3 89.3 - 73.4 %   MCV 99.0 80.0 - 100.0 fL   MCH 30.7 26.0 - 34.0 pg   MCHC 31.0 30.0 - 36.0 g/dL   RDW 28.7 68.1 - 15.7 %   Platelets 244 150 - 400 K/uL   nRBC 0.0 0.0 - 0.2 %   Neutrophils Relative %  59 %   Neutro Abs 6.7 1.7 - 7.7 K/uL   Lymphocytes Relative 24 %   Lymphs Abs 2.7 0.7 - 4.0 K/uL   Monocytes Relative 9 %   Monocytes Absolute 1.0 0.1 - 1.0 K/uL   Eosinophils Relative 7 %   Eosinophils Absolute 0.8 (H) 0.0 - 0.5 K/uL   Basophils Relative 1 %   Basophils Absolute 0.1 0.0 - 0.1 K/uL   Immature Granulocytes 0 %   Abs Immature Granulocytes 0.05 0.00 - 0.07 K/uL  Brain natriuretic peptide  Result Value Ref Range   B Natriuretic Peptide 79.1 0.0 - 100.0 pg/mL  Comprehensive metabolic panel  Result Value Ref Range   Sodium 140 135 - 145 mmol/L   Potassium 5.0 3.5 - 5.1 mmol/L   Chloride 100 98 - 111 mmol/L   CO2 31 22 - 32 mmol/L   Glucose, Bld 90 70 - 99 mg/dL   BUN 30 (H) 8 - 23 mg/dL   Creatinine, Ser 1.54 (H) 0.44 - 1.00 mg/dL   Calcium 9.2 8.9 - 00.8 mg/dL   Total Protein 6.9 6.5 - 8.1 g/dL   Albumin 3.1 (L) 3.5 - 5.0 g/dL   AST 36 15 - 41 U/L   ALT 23 0 - 44 U/L   Alkaline Phosphatase 304 (H) 38 - 126 U/L   Total Bilirubin 0.8 0.3 - 1.2 mg/dL   GFR calc non Af Amer 35 (L) >60 mL/min   GFR calc Af Amer 40 (L) >60 mL/min   Anion gap 9 5 - 15  Urinalysis, Routine w reflex microscopic  Result Value Ref Range   Color, Urine STRAW (A) YELLOW   APPearance CLEAR CLEAR   Specific Gravity, Urine 1.009 1.005 - 1.030   pH 8.0 5.0 - 8.0   Glucose, UA NEGATIVE NEGATIVE mg/dL   Hgb urine dipstick NEGATIVE  NEGATIVE   Bilirubin Urine NEGATIVE NEGATIVE   Ketones, ur NEGATIVE NEGATIVE mg/dL   Protein, ur NEGATIVE NEGATIVE mg/dL   Nitrite NEGATIVE NEGATIVE   Leukocytes,Ua NEGATIVE NEGATIVE  Gamma GT  Result Value Ref Range   GGT 258 (H) 7 - 50 U/L  C-reactive protein  Result Value Ref Range   CRP 1.4 (H) <1.0 mg/dL  CBC with Differential/Platelet  Result Value Ref Range   WBC 10.4 4.0 - 10.5 K/uL   RBC 3.88 3.87 - 5.11 MIL/uL   Hemoglobin 12.0 12.0 - 15.0 g/dL   HCT 67.6 19.5 - 09.3 %   MCV 99.0 80.0 - 100.0 fL   MCH 30.9 26.0 - 34.0 pg   MCHC 31.3 30.0 - 36.0 g/dL   RDW 26.7 12.4 - 58.0 %   Platelets 229 150 - 400 K/uL   nRBC 0.0 0.0 - 0.2 %   Neutrophils Relative % 62 %   Neutro Abs 6.4 1.7 - 7.7 K/uL   Lymphocytes Relative 21 %   Lymphs Abs 2.2 0.7 - 4.0 K/uL   Monocytes Relative 10 %   Monocytes Absolute 1.0 0.1 - 1.0 K/uL   Eosinophils Relative 6 %   Eosinophils Absolute 0.7 (H) 0.0 - 0.5 K/uL   Basophils Relative 1 %   Basophils Absolute 0.1 0.0 - 0.1 K/uL   Immature Granulocytes 0 %   Abs Immature Granulocytes 0.03 0.00 - 0.07 K/uL  Comprehensive metabolic panel  Result Value Ref Range   Sodium 141 135 - 145 mmol/L   Potassium 5.0 3.5 - 5.1 mmol/L   Chloride 100 98 - 111 mmol/L   CO2  33 (H) 22 - 32 mmol/L   Glucose, Bld 102 (H) 70 - 99 mg/dL   BUN 28 (H) 8 - 23 mg/dL   Creatinine, Ser 7.06 (H) 0.44 - 1.00 mg/dL   Calcium 9.2 8.9 - 23.7 mg/dL   Total Protein 6.9 6.5 - 8.1 g/dL   Albumin 2.9 (L) 3.5 - 5.0 g/dL   AST 37 15 - 41 U/L   ALT 24 0 - 44 U/L   Alkaline Phosphatase 304 (H) 38 - 126 U/L   Total Bilirubin 0.9 0.3 - 1.2 mg/dL   GFR calc non Af Amer 35 (L) >60 mL/min   GFR calc Af Amer 40 (L) >60 mL/min   Anion gap 8 5 - 15  Hemoglobin A1c  Result Value Ref Range   Hgb A1c MFr Bld 5.8 (H) 4.8 - 5.6 %   Mean Plasma Glucose 119.76 mg/dL  TSH  Result Value Ref Range   TSH 5.881 (H) 0.350 - 4.500 uIU/mL  Magnesium  Result Value Ref Range   Magnesium 2.3  1.7 - 2.4 mg/dL    EKG None  Radiology No results found.  Procedures Procedures (including critical care time)  Medications Ordered in ED Medications  furosemide (LASIX) injection 60 mg (has no administration in time range)  methocarbamol (ROBAXIN) injection 500 mg (has no administration in time range)  fentaNYL (SUBLIMAZE) injection 50 mcg (has no administration in time range)    ED Course  I have reviewed the triage vital signs and the nursing notes.  Pertinent labs & imaging results that were available during my care of the patient were reviewed by me and considered in my medical decision making (see chart for details).    MDM Rules/Calculators/A&P                      Patient to ED with ss/sxs as detailed in the HPI. Daughter is at bedside and offers additional history. She states that the patient's ability to get up and walk is decreasing despite having a walker at home. She feels this is due to back pain but also to LE swelling, which she says is much worse than the patient's baseline, though the patient denies this. She has been prescribed Lasix but the patient admits that she does not take it regularly.   She is overall nontoxic. Her lower extremities are markedly edematous. Una boots are in place. No evidence of infection.  IV Lasix ordered while labs are pending.   Her back pain is left lateral and extends over a large area. The pain she describes is c/w muscle spasm. Other causes considered were dissection, aneurysm rupture, however, review of the chart shows recent CT scan of the abdomen did not reveal Vascular abnormalities.  IV robaxin provided for pain.   Cr is elevated at 1.3. CXR shows edema vs infiltrates. Doubt Pneumonia without fever, cough or SOB. BNP is insignificant.    The patient is seen by Dr. Preston Fleeting. Given what appears are fluid overload, mild AKI, abnormal CXR, admission is felt indicated for further care.   Final Clinical Impression(s) / ED  Diagnoses Final diagnoses:  None   1. Peripheral edema 2. Back pain 3. Difficult ambulation affecting mobility 4. AKI  Rx / DC Orders ED Discharge Orders    None       Danne Harbor 03/18/20 0724    Dione Booze, MD 03/20/20 1442

## 2020-03-17 NOTE — ED Triage Notes (Signed)
Per EMS- patient c/o left back pain-left scapula to the left hip x 2 weeks.

## 2020-03-18 ENCOUNTER — Observation Stay (HOSPITAL_BASED_OUTPATIENT_CLINIC_OR_DEPARTMENT_OTHER): Payer: Medicare HMO

## 2020-03-18 ENCOUNTER — Encounter (HOSPITAL_COMMUNITY): Payer: Self-pay | Admitting: Internal Medicine

## 2020-03-18 ENCOUNTER — Emergency Department (HOSPITAL_COMMUNITY): Payer: Medicare HMO

## 2020-03-18 ENCOUNTER — Other Ambulatory Visit: Payer: Self-pay

## 2020-03-18 DIAGNOSIS — E039 Hypothyroidism, unspecified: Secondary | ICD-10-CM

## 2020-03-18 DIAGNOSIS — N1831 Chronic kidney disease, stage 3a: Secondary | ICD-10-CM

## 2020-03-18 DIAGNOSIS — I10 Essential (primary) hypertension: Secondary | ICD-10-CM | POA: Diagnosis not present

## 2020-03-18 DIAGNOSIS — R918 Other nonspecific abnormal finding of lung field: Secondary | ICD-10-CM | POA: Diagnosis present

## 2020-03-18 DIAGNOSIS — K219 Gastro-esophageal reflux disease without esophagitis: Secondary | ICD-10-CM

## 2020-03-18 DIAGNOSIS — E1121 Type 2 diabetes mellitus with diabetic nephropathy: Secondary | ICD-10-CM

## 2020-03-18 DIAGNOSIS — I890 Lymphedema, not elsewhere classified: Secondary | ICD-10-CM

## 2020-03-18 DIAGNOSIS — I5040 Unspecified combined systolic (congestive) and diastolic (congestive) heart failure: Secondary | ICD-10-CM

## 2020-03-18 DIAGNOSIS — M549 Dorsalgia, unspecified: Secondary | ICD-10-CM | POA: Diagnosis not present

## 2020-03-18 DIAGNOSIS — N1832 Chronic kidney disease, stage 3b: Secondary | ICD-10-CM

## 2020-03-18 DIAGNOSIS — Z8616 Personal history of COVID-19: Secondary | ICD-10-CM

## 2020-03-18 HISTORY — DX: Personal history of COVID-19: Z86.16

## 2020-03-18 LAB — CBC WITH DIFFERENTIAL/PLATELET
Abs Immature Granulocytes: 0.03 10*3/uL (ref 0.00–0.07)
Basophils Absolute: 0.1 10*3/uL (ref 0.0–0.1)
Basophils Relative: 1 %
Eosinophils Absolute: 0.7 10*3/uL — ABNORMAL HIGH (ref 0.0–0.5)
Eosinophils Relative: 6 %
HCT: 38.4 % (ref 36.0–46.0)
Hemoglobin: 12 g/dL (ref 12.0–15.0)
Immature Granulocytes: 0 %
Lymphocytes Relative: 21 %
Lymphs Abs: 2.2 10*3/uL (ref 0.7–4.0)
MCH: 30.9 pg (ref 26.0–34.0)
MCHC: 31.3 g/dL (ref 30.0–36.0)
MCV: 99 fL (ref 80.0–100.0)
Monocytes Absolute: 1 10*3/uL (ref 0.1–1.0)
Monocytes Relative: 10 %
Neutro Abs: 6.4 10*3/uL (ref 1.7–7.7)
Neutrophils Relative %: 62 %
Platelets: 229 10*3/uL (ref 150–400)
RBC: 3.88 MIL/uL (ref 3.87–5.11)
RDW: 13.2 % (ref 11.5–15.5)
WBC: 10.4 10*3/uL (ref 4.0–10.5)
nRBC: 0 % (ref 0.0–0.2)

## 2020-03-18 LAB — COMPREHENSIVE METABOLIC PANEL
ALT: 23 U/L (ref 0–44)
ALT: 24 U/L (ref 0–44)
AST: 36 U/L (ref 15–41)
AST: 37 U/L (ref 15–41)
Albumin: 2.9 g/dL — ABNORMAL LOW (ref 3.5–5.0)
Albumin: 3.1 g/dL — ABNORMAL LOW (ref 3.5–5.0)
Alkaline Phosphatase: 304 U/L — ABNORMAL HIGH (ref 38–126)
Alkaline Phosphatase: 304 U/L — ABNORMAL HIGH (ref 38–126)
Anion gap: 8 (ref 5–15)
Anion gap: 9 (ref 5–15)
BUN: 28 mg/dL — ABNORMAL HIGH (ref 8–23)
BUN: 30 mg/dL — ABNORMAL HIGH (ref 8–23)
CO2: 31 mmol/L (ref 22–32)
CO2: 33 mmol/L — ABNORMAL HIGH (ref 22–32)
Calcium: 9.2 mg/dL (ref 8.9–10.3)
Calcium: 9.2 mg/dL (ref 8.9–10.3)
Chloride: 100 mmol/L (ref 98–111)
Chloride: 100 mmol/L (ref 98–111)
Creatinine, Ser: 1.4 mg/dL — ABNORMAL HIGH (ref 0.44–1.00)
Creatinine, Ser: 1.41 mg/dL — ABNORMAL HIGH (ref 0.44–1.00)
GFR calc Af Amer: 40 mL/min — ABNORMAL LOW (ref >60)
GFR calc Af Amer: 40 mL/min — ABNORMAL LOW (ref >60)
GFR calc non Af Amer: 35 mL/min — ABNORMAL LOW (ref >60)
GFR calc non Af Amer: 35 mL/min — ABNORMAL LOW (ref >60)
Glucose, Bld: 102 mg/dL — ABNORMAL HIGH (ref 70–99)
Glucose, Bld: 90 mg/dL (ref 70–99)
Potassium: 5 mmol/L (ref 3.5–5.1)
Potassium: 5 mmol/L (ref 3.5–5.1)
Sodium: 140 mmol/L (ref 135–145)
Sodium: 141 mmol/L (ref 135–145)
Total Bilirubin: 0.8 mg/dL (ref 0.3–1.2)
Total Bilirubin: 0.9 mg/dL (ref 0.3–1.2)
Total Protein: 6.9 g/dL (ref 6.5–8.1)
Total Protein: 6.9 g/dL (ref 6.5–8.1)

## 2020-03-18 LAB — URINALYSIS, ROUTINE W REFLEX MICROSCOPIC
Bilirubin Urine: NEGATIVE
Glucose, UA: NEGATIVE mg/dL
Hgb urine dipstick: NEGATIVE
Ketones, ur: NEGATIVE mg/dL
Leukocytes,Ua: NEGATIVE
Nitrite: NEGATIVE
Protein, ur: NEGATIVE mg/dL
Specific Gravity, Urine: 1.009 (ref 1.005–1.030)
pH: 8 (ref 5.0–8.0)

## 2020-03-18 LAB — GLUCOSE, CAPILLARY
Glucose-Capillary: 98 mg/dL (ref 70–99)
Glucose-Capillary: 84 mg/dL (ref 70–99)
Glucose-Capillary: 83 mg/dL (ref 70–99)
Glucose-Capillary: 125 mg/dL — ABNORMAL HIGH (ref 70–99)

## 2020-03-18 LAB — URINE CULTURE

## 2020-03-18 LAB — TSH: TSH: 5.881 u[IU]/mL — ABNORMAL HIGH (ref 0.350–4.500)

## 2020-03-18 LAB — C-REACTIVE PROTEIN: CRP: 1.4 mg/dL — ABNORMAL HIGH (ref <1.0)

## 2020-03-18 LAB — GAMMA GT: GGT: 258 U/L — ABNORMAL HIGH (ref 7–50)

## 2020-03-18 LAB — T4, FREE: Free T4: 1.35 ng/dL — ABNORMAL HIGH (ref 0.61–1.12)

## 2020-03-18 LAB — CBG MONITORING, ED: Glucose-Capillary: 80 mg/dL (ref 70–99)

## 2020-03-18 LAB — SARS CORONAVIRUS 2 (TAT 6-24 HRS): SARS Coronavirus 2: NEGATIVE

## 2020-03-18 LAB — BRAIN NATRIURETIC PEPTIDE: B Natriuretic Peptide: 79.1 pg/mL (ref 0.0–100.0)

## 2020-03-18 LAB — ECHOCARDIOGRAM COMPLETE

## 2020-03-18 LAB — MAGNESIUM: Magnesium: 2.3 mg/dL (ref 1.7–2.4)

## 2020-03-18 LAB — HEMOGLOBIN A1C
Hgb A1c MFr Bld: 5.8 % — ABNORMAL HIGH (ref 4.8–5.6)
Mean Plasma Glucose: 119.76 mg/dL

## 2020-03-18 MED ORDER — CARVEDILOL 12.5 MG PO TABS
12.5000 mg | ORAL_TABLET | Freq: Every day | ORAL | Status: DC
  Administered 2020-03-18 – 2020-03-19 (×2): 12.5 mg via ORAL
  Filled 2020-03-18 (×2): qty 1

## 2020-03-18 MED ORDER — NYSTATIN 100000 UNIT/GM EX POWD
Freq: Three times a day (TID) | CUTANEOUS | Status: DC
  Filled 2020-03-18 (×2): qty 15

## 2020-03-18 MED ORDER — TRAMADOL HCL 50 MG PO TABS
100.0000 mg | ORAL_TABLET | Freq: Four times a day (QID) | ORAL | Status: DC | PRN
  Administered 2020-03-19 (×2): 100 mg via ORAL
  Filled 2020-03-18 (×2): qty 2

## 2020-03-18 MED ORDER — FUROSEMIDE 10 MG/ML IJ SOLN
40.0000 mg | Freq: Two times a day (BID) | INTRAMUSCULAR | Status: AC
  Administered 2020-03-18 (×2): 40 mg via INTRAVENOUS
  Filled 2020-03-18 (×2): qty 4

## 2020-03-18 MED ORDER — ONDANSETRON HCL 4 MG/2ML IJ SOLN: 4.0000 mg | Freq: Four times a day (QID) | INTRAMUSCULAR | Status: DC | PRN

## 2020-03-18 MED ORDER — TRAMADOL HCL 50 MG PO TABS
50.0000 mg | ORAL_TABLET | Freq: Four times a day (QID) | ORAL | Status: DC | PRN
  Administered 2020-03-18 (×2): 50 mg via ORAL
  Filled 2020-03-18 (×2): qty 1

## 2020-03-18 MED ORDER — ALBUTEROL SULFATE HFA 108 (90 BASE) MCG/ACT IN AERS: 2.0000 | INHALATION_SPRAY | Freq: Four times a day (QID) | RESPIRATORY_TRACT | Status: DC | PRN

## 2020-03-18 MED ORDER — ACETAMINOPHEN 325 MG PO TABS
650.0000 mg | ORAL_TABLET | Freq: Four times a day (QID) | ORAL | Status: DC | PRN
  Administered 2020-03-18: 650 mg via ORAL
  Filled 2020-03-18 (×2): qty 2

## 2020-03-18 MED ORDER — LEVOTHYROXINE SODIUM 75 MCG PO TABS
150.0000 ug | ORAL_TABLET | Freq: Every evening | ORAL | Status: DC
  Administered 2020-03-18 – 2020-03-19 (×2): 150 ug via ORAL
  Filled 2020-03-18 (×2): qty 2

## 2020-03-18 MED ORDER — LISINOPRIL 20 MG PO TABS
40.0000 mg | ORAL_TABLET | Freq: Every evening | ORAL | Status: DC
  Administered 2020-03-18 – 2020-03-19 (×2): 40 mg via ORAL
  Filled 2020-03-18 (×2): qty 2

## 2020-03-18 MED ORDER — ALBUTEROL SULFATE (2.5 MG/3ML) 0.083% IN NEBU
2.5000 mg | INHALATION_SOLUTION | Freq: Four times a day (QID) | RESPIRATORY_TRACT | Status: DC | PRN
  Administered 2020-03-19 – 2020-03-20 (×2): 2.5 mg via RESPIRATORY_TRACT
  Filled 2020-03-18 (×2): qty 3

## 2020-03-18 MED ORDER — ENOXAPARIN SODIUM 40 MG/0.4ML ~~LOC~~ SOLN
40.0000 mg | SUBCUTANEOUS | Status: DC
  Administered 2020-03-18 – 2020-03-20 (×3): 40 mg via SUBCUTANEOUS
  Filled 2020-03-18 (×3): qty 0.4

## 2020-03-18 MED ORDER — INSULIN ASPART 100 UNIT/ML ~~LOC~~ SOLN
0.0000 [IU] | Freq: Three times a day (TID) | SUBCUTANEOUS | Status: DC
  Administered 2020-03-18 – 2020-03-19 (×3): 2 [IU] via SUBCUTANEOUS
  Filled 2020-03-18: qty 0.15

## 2020-03-18 MED ORDER — ACETAMINOPHEN 650 MG RE SUPP: 650.0000 mg | Freq: Four times a day (QID) | RECTAL | Status: DC | PRN

## 2020-03-18 MED ORDER — ACETAMINOPHEN 325 MG PO TABS
650.0000 mg | ORAL_TABLET | Freq: Two times a day (BID) | ORAL | Status: DC
  Administered 2020-03-18 – 2020-03-20 (×4): 650 mg via ORAL
  Filled 2020-03-18 (×4): qty 2

## 2020-03-18 MED ORDER — PANTOPRAZOLE SODIUM 40 MG PO TBEC
40.0000 mg | DELAYED_RELEASE_TABLET | Freq: Every day | ORAL | Status: DC
  Administered 2020-03-18 – 2020-03-20 (×3): 40 mg via ORAL
  Filled 2020-03-18 (×3): qty 1

## 2020-03-18 MED ORDER — AMLODIPINE BESYLATE 5 MG PO TABS
5.0000 mg | ORAL_TABLET | Freq: Every evening | ORAL | Status: DC
  Administered 2020-03-18 – 2020-03-19 (×2): 5 mg via ORAL
  Filled 2020-03-18 (×2): qty 1

## 2020-03-18 MED ORDER — POLYETHYLENE GLYCOL 3350 17 G PO PACK: 17.0000 g | PACK | Freq: Every day | ORAL | Status: DC | PRN

## 2020-03-18 MED ORDER — ONDANSETRON HCL 4 MG PO TABS: 4.0000 mg | ORAL_TABLET | Freq: Four times a day (QID) | ORAL | Status: DC | PRN

## 2020-03-18 NOTE — H&P (Signed)
History and Physical    Brandy Morris RXV:[id number] DOB: 06/01/1935 DOA: 03/17/2020  PCP: Brandy Due., MD  Patient coming from: Home   Chief Complaint:  Chief Complaint  Patient presents with  . Back Pain     HPI:  85 year old female with past medical history of diabetes mellitus type 2, hypertension, morbid obesity, lymphedema of the lower extremities, gastroesophageal reflux disease, hypothyroidism, chronic kidney disease stage IIIa and recent COVID-19 infection in January 2021 who presents to [facility] emergency department with complaints of low back pain.  Patient explains that approximately 2 weeks ago she began to experience low back pain.  This pain is sharp in quality, severe in intensity and nonradiating.  Patient states this pain is worse with movement and improved with rest.  Patient denies complete loss of bowel or bladder control.  Patient denies any associated bilateral lower extremity weakness although she typically ambulates using a walker at baseline.  Patient denies any dysuria, abdominal pain, diarrhea, fevers, nausea, vomiting, vaginal discharge or sick contacts.  Patient symptoms continue to worsen to the point where she experienced worsening difficulty with ambulation and inability to leave the house due to the severity of her pain.  Because of pains progressively worsening symptoms the patient contacted EMS who then brought the patient into [facility] long hospital emergency department for evaluation.  In the emergency department chest x-ray did reveal patchy bilateral infiltrates with a mild leukocytosis of 12.1.  60 mg of IV Lasix was administered.  Due to patient's ongoing complaints of intractable back pain the hospitalist group has been called to assess the patient for admission the hospital.   Review of Systems: A 10-system review of systems has been performed and all systems are negative with the exception of what is listed in the HPI.   Past  Medical History:  Diagnosis Date  . Diabetes mellitus without complication (HCC)   . GERD (gastroesophageal reflux disease)   . History of COVID-19 03/18/2020  . Hypertension     Past Surgical History:  Procedure Laterality Date  . ABDOMINAL HYSTERECTOMY    . Bladder Tack    . CHOLECYSTECTOMY    . JOINT REPLACEMENT    . THYROIDECTOMY    . TUBAL LIGATION       reports that she has never smoked. She has never used smokeless tobacco. She reports that she does not drink alcohol or use drugs.  Allergies  Allergen Reactions  . Ciprofloxacin Hives  . Codeine     Don't like the way it makes me feel.  . Latex Itching, Rash and Other (See Comments)  . Povidone-Iodine Rash    Other reaction(s): Other (See Comments) unknown     Family History  Problem Relation Age of Onset  . Pneumonia Mother      Prior to Admission medications   Medication Sig Start Date End Date Taking? Authorizing Provider  albuterol (VENTOLIN HFA) 108 (90 Base) MCG/ACT inhaler Inhale 2 puffs into the lungs every 6 (six) hours as needed for wheezing or shortness of breath. 12/01/19   Almon Hercules, MD  amLODipine (NORVASC) 5 MG tablet Take 5 mg by mouth every evening.  03/14/16   [provider]  carvedilol (COREG) 6.25 MG tablet Take 12.5 mg by mouth at bedtime. 10/30/19   [provider]  dextromethorphan-guaiFENesin (MUCINEX DM) 30-600 MG 12hr tablet Take 1 tablet by mouth 2 (two) times daily. 12/01/19   Almon Hercules, MD  furosemide (LASIX) 40 MG tablet  Take 40 mg by mouth daily. 11/25/19   [provider]  glimepiride (AMARYL) 1 MG tablet Take 1 mg by mouth daily.    [provider]  Glucose Blood (BLOOD GLUCOSE TEST STRIPS) STRP 1 strip by Other route 3 (three) times a week. 01/26/19   [provider]  levothyroxine (SYNTHROID) 150 MCG tablet Take 150 mcg by mouth every evening.    [provider]  lisinopril (ZESTRIL) 20 MG tablet Take 40 mg by mouth every  evening. 09/14/19 09/13/20  [provider]  nystatin (MYCOSTATIN/NYSTOP) powder Apply 1 application topically 3 (three) times daily as needed for rash. Apply to groin area tid prn for yeast in groin area. 03/24/18   [provider]  omeprazole (PRILOSEC) 20 MG capsule Take 20 mg by mouth daily. 12/06/19   [provider]  predniSONE (DELTASONE) 50 MG tablet Take 1 tablet (50 mg total) by mouth daily. 01/15/20   Dorie Rank, MD  triamcinolone cream (KENALOG) 0.1 % Apply 1 application topically daily as needed (rash).  10/16/19   [provider]    Physical Exam: Vitals:   03/17/20 1425 03/17/20 2215 03/17/20 2315 03/18/20 0100  BP:  (!) 161/75 (!) 144/74 137/80  Pulse:  72 64 62  Resp:  17 18 18   Temp:      TempSrc:      SpO2: 96% 96% 100% 97%    Constitutional: Acute alert and oriented x3, in distress due to back pain, patient is obese. Skin: Notably thickened erythematous skin of the bilateral lower extremities.  Unna boots are in place.   Eyes: Pupils are equally reactive to light.  No evidence of scleral icterus or conjunctival pallor.  ENMT: Mucous membranes are moist. Posterior pharynx clear of any exudate or lesions. Normal dentition.   Neck: normal, supple, no masses, no thyromegaly Respiratory: Diminished breath sounds at the bases with scattered rhonchi bilaterally.  Faint bibasilar rales.  No evidence of wheezing.  Normal respiratory effort. No accessory muscle use.  Cardiovascular: Regular rate and rhythm, no murmurs / rubs / gallops. No extremity edema. 2+ pedal pulses. No carotid bruits.  Back:   Nontender without crepitus or deformity. Abdomen: Abdomen is protuberant but soft and nontender.    No evidence of intra-abdominal masses.  Positive bowel sounds noted in all quadrants.   Musculoskeletal: Notable mild tenderness of the distal bilateral lower extremities.  Unna boots are in place.   Normal muscle tone.  Neurologic: Patient is moving all  4 extremities spontaneously.  Patient is awake alert and oriented x3.  Patient is responsive to verbal and painful stimuli.  Sensation is grossly intact.  Cranial nerves II through XII are grossly intact.   Psychiatric: Patient presents as a normal mood with appropriate affect.  Patient seems to possess insight as to theircurrent situation.     Labs on Admission: I have personally reviewed following labs and imaging studies -   CBC: Recent Labs  Lab 03/17/20 2331  WBC 11.2*  NEUTROABS 6.7  HGB 12.0  HCT 38.7  MCV 99.0  PLT 267   Basic Metabolic Panel: Recent Labs  Lab 03/17/20 2331  NA 140  K 5.0  CL 100  CO2 31  GLUCOSE 90  BUN 30*  CREATININE 1.40*  CALCIUM 9.2   GFR: CrCl cannot be calculated (Unknown ideal weight.). Liver Function Tests: Recent Labs  Lab 03/17/20 2331  AST 36  ALT 23  ALKPHOS 304*  BILITOT 0.8  PROT 6.9  ALBUMIN  3.1*   No results for input(s): LIPASE, AMYLASE in the last 168 hours. No results for input(s): AMMONIA in the last 168 hours. Coagulation Profile: No results for input(s): INR, PROTIME in the last 168 hours. Cardiac Enzymes: No results for input(s): CKTOTAL, CKMB, CKMBINDEX, TROPONINI in the last 168 hours. BNP (last 3 results) No results for input(s): PROBNP in the last 8760 hours. HbA1C: No results for input(s): HGBA1C in the last 72 hours. CBG: No results for input(s): GLUCAP in the last 168 hours. Lipid Profile: No results for input(s): CHOL, HDL, LDLCALC, TRIG, CHOLHDL, LDLDIRECT in the last 72 hours. Thyroid Function Tests: No results for input(s): TSH, T4TOTAL, FREET4, T3FREE, THYROIDAB in the last 72 hours. Anemia Panel: No results for input(s): VITAMINB12, FOLATE, FERRITIN, TIBC, IRON, RETICCTPCT in the last 72 hours. Urine analysis:    Component Value Date/Time   COLORURINE YELLOW 06/11/2008 1105   APPEARANCEUR CLEAR 06/11/2008 1105   LABSPEC 1.015 06/11/2008 1105   PHURINE 6.5 06/11/2008 1105   GLUCOSEU  NEGATIVE 06/11/2008 1105   HGBUR NEGATIVE 06/11/2008 1105   BILIRUBINUR NEGATIVE 06/11/2008 1105   KETONESUR NEGATIVE 06/11/2008 1105   PROTEINUR NEGATIVE 06/11/2008 1105   UROBILINOGEN 0.2 06/11/2008 1105   NITRITE NEGATIVE 06/11/2008 1105   LEUKOCYTESUR  06/11/2008 1105    NEGATIVE MICROSCOPIC NOT DONE ON URINES WITH NEGATIVE PROTEIN, BLOOD, LEUKOCYTES, NITRITE, OR GLUCOSE <1000 mg/dL.    Radiological Exams on Admission personally reviewed: DG Chest Portable 1 View  Result Date: 03/17/2020 CLINICAL DATA:  Peripheral edema EXAM: PORTABLE CHEST 1 VIEW COMPARISON:  January 15, 2020 FINDINGS: The heart size and mediastinal contours are within normal limits. Mildly increased interstitial markings are seen throughout both lungs. There is also slightly patchy airspace opacity seen within the right upper lobe. Aortic knob calcifications are seen. No pleural effusion. No acute osseous abnormality. IMPRESSION: Interstitial/patchy airspace opacity within both lungs which could be due to edema and/or infectious etiology. Electronically Signed   By: Jonna Clark M.D.   On: 03/17/2020 23:20    Assessment/Plan Active Problems:   Intractable back pain   Patient presenting with intractable back pain progressively worsening for 2 weeks  While patient does not exhibit any red flag symptoms concerning for cauda equina syndrome, patient is experiencing progressive difficulty in ambulation and currently refuses to go home this evening due to the degree of pain.  There is notable midline tenderness of the thoracic and lumbar spine without deformities.  Obtaining CT imaging without contrast of the thoracic and lumbar spine.  Pending urinalysis  Placing patient on as needed Ultram for moderate to severe pain considering patient's advanced age-patient reports having substantial side effects with morphine in the past.  PT evaluation ordered for the morning.   Patient will likely require home health  physical therapy at time of discharge, likely tomorrow    Bilateral pulmonary infiltrates on chest x-ray   Patchy bilateral infiltrates noted on chest x-ray  Additionally, patient noted to become hypoxic whenever attempting to lie flat in the emergency department  While patient denies any worsening of her bilateral lower extremity swelling, the emergency department provider states that the Brandy Morris states that the swelling has been worse as of late  While patient does exhibit a very mild leukocytosis patient denies cough or fever making pneumonia unlikely.  Etiology of the infiltrates is unclear at this time.  While patient did have Covid in January it is unlikely that these infiltrates are residual COVID-19  Attempting a trial of intravenous  Lasix for several doses to monitor for improvement in pillow orthopnea/hypoxia  Obtaining echocardiogram despite normal BNP  Repeat COVID-19 testing ordered although likely to be positive (infection 4 months ago)  CT imaging of the chest has been of ordered to better characterize these infiltrates    Hypothyroidism   Continue home regimen of Synthroid  Obtain TSH    Type 2 diabetes mellitus with stage 3 chronic kidney disease, without long-term current use of insulin (HCC)   Accu-Cheks before every meal and nightly with sliding scale insulin    Essential hypertension   Continue home regimen of antihypertensives    Chronic kidney disease, stage 3a   Slight increase in creatinine compared to prior hospitalization  We will monitor renal function closely, particularly with trial doses of Lasix  Strict input and output monitoring    Lymphedema of both lower extremities   Worsening bilateral lower extremity edema according to Brandy Morris who spoke to the emergency department staff  Patient was due to have her Unna boots exchanged today in wound care clinic but was unable to go in due to her intractable low back pain  Will order  wound care consultation in case they offer this service - if not patient will need to wait until she follows up again with wound care    GERD (gastroesophageal reflux disease)   Continue daily PPI     Code Status:  Full code Family Communication: Brandy Morris was briefed by the emergency department staff  Status is: Observation  The patient remains OBS appropriate and will d/c before 2 midnights.  Dispo: The patient is from: Home              Anticipated d/c is to: Home              Anticipated d/c date is: 1 day              Patient currently is not medically stable to d/c.        Marinda Elk MD [facility] Pager [phone]  If 7PM-7AM, please contact night-coverage [email or web address] Use universal  password for that web site. If you do not have the password, please call the hospital operator.  03/18/2020, 2:41 AM

## 2020-03-18 NOTE — Progress Notes (Signed)
Orthopedic Tech Progress Note Patient Details:  Brandy Morris 06-13-1935 [id number]  Ortho Devices Type of Ortho Device: Roland Rack boot Ortho Device/Splint Location: Bilateral unna boots Ortho Device/Splint Interventions: Application   Post Interventions Patient Tolerated: Well Instructions Provided: Care of device   Saul Fordyce 03/18/2020, 3:38 PM

## 2020-03-18 NOTE — NC FL2 (Addendum)
[place] MEDICAID FL2 LEVEL OF CARE SCREENING TOOL     IDENTIFICATION  Patient Name: Brandy Morris Birthdate: 03-09-1935 Sex: female Admission Date (Current Location): 03/17/2020  [facility] and [place] Number:  Producer, television/film/video and Address:  [facility],  [address], [place] [address]      Provider Number: [id number]  Attending Physician Name and Address:  [place], Eric J, DO  Relative Name and Phone Number:  Catia, Todorov Spouse [phone]    Current Level of Care: Hospital Recommended Level of Care: Skilled Nursing Facility Prior Approval Number:    Date Approved/Denied:   PASRR Number: [id number]  Discharge Plan: SNF    Current Diagnoses: Patient Active Problem List   Diagnosis Date Noted  . Chronic kidney disease, stage 3a 03/18/2020  . Intractable back pain 03/18/2020  . Lymphedema of both lower extremities 03/18/2020  . Bilateral pulmonary infiltrates on chest x-ray 03/18/2020  . Pressure injury of skin 11/28/2019  . GERD (gastroesophageal reflux disease) 11/27/2019  . Hypothyroidism 11/27/2019  . Type 2 diabetes mellitus with stage 3 chronic kidney disease, without long-term current use of insulin (HCC) 11/27/2019  . Essential hypertension 11/27/2019    Orientation RESPIRATION BLADDER Height & Weight     Self, Time, Situation, Place  O2 Continent Weight:   Height:     BEHAVIORAL SYMPTOMS/MOOD NEUROLOGICAL BOWEL NUTRITION STATUS      Continent Diet  AMBULATORY STATUS COMMUNICATION OF NEEDS Skin   Extensive Assist Verbally Other (Comment)(:Chronic venous insufficiency and lymphedema.  Wears weekly Unna boots.  Breakdown to left lateral malleolus and weeping to right anterior lower leg.)   sacral wounds.   Wound type: pressure and moisture stage 2 pressure injury Pressure Injury POA:  Cleanse sacral wound with NS and pat dry.  Apply aquacel to wound bed.  Cover with silicone foam dressing. Change every three days.  Encourage  to turn and reposition every two hours                       Personal Care Assistance Level of Assistance  Bathing, Feeding, Dressing Bathing Assistance: Limited assistance Feeding assistance: Independent Dressing Assistance: Limited assistance     Functional Limitations Info  Sight, Hearing, Speech Sight Info: Adequate Hearing Info: Adequate Speech Info: Adequate    SPECIAL CARE FACTORS FREQUENCY  PT (By licensed PT), OT (By licensed OT)     PT Frequency: 5x/week OT Frequency: 5x/week            Contractures Contractures Info: Not present    Additional Factors Info  Code Status, Allergies, Psychotropic Code Status Info: Fullcode Allergies Info: Allergies: Ciprofloxacin, Codeine, Latex, Povidone-iodine           Current Medications (03/18/2020):  This is the current hospital active medication list Current Facility-Administered Medications  Medication Dose Route Frequency Provider Last Rate Last Admin  . acetaminophen (TYLENOL) tablet 650 mg  650 mg Oral Q6H PRN Marinda Elk, MD   650 mg at 03/18/20 5465   Or  . acetaminophen (TYLENOL) suppository 650 mg  650 mg Rectal Q6H PRN Marinda Elk, MD      . acetaminophen (TYLENOL) tablet 650 mg  650 mg Oral BID Marinda Elk, MD      . albuterol (PROVENTIL) (2.5 MG/3ML) 0.083% nebulizer solution 2.5 mg  2.5 mg Nebulization Q6H PRN [place], Eric J, DO      . amLODipine (NORVASC) tablet 5 mg  5 mg Oral QPM Shalhoub,  Sherryll Burger, MD      . carvedilol (COREG) tablet 12.5 mg  12.5 mg Oral QHS Shalhoub, Sherryll Burger, MD      . enoxaparin (LOVENOX) injection 40 mg  40 mg Subcutaneous Q24H Shalhoub, Sherryll Burger, MD   40 mg at 03/18/20 1111  . furosemide (LASIX) injection 40 mg  40 mg Intravenous BID Vernelle Emerald, MD   40 mg at 03/18/20 1111  . insulin aspart (novoLOG) injection 0-15 Units  0-15 Units Subcutaneous TID AC & HS Shalhoub, Sherryll Burger, MD      . levothyroxine (SYNTHROID) tablet 150 mcg  150 mcg Oral QPM  Shalhoub, Sherryll Burger, MD      . lisinopril (ZESTRIL) tablet 40 mg  40 mg Oral QPM Shalhoub, Sherryll Burger, MD      . ondansetron [facility]) tablet 4 mg  4 mg Oral Q6H PRN Shalhoub, Sherryll Burger, MD       Or  . ondansetron [facility]) injection 4 mg  4 mg Intravenous Q6H PRN Shalhoub, Sherryll Burger, MD      . pantoprazole (PROTONIX) EC tablet 40 mg  40 mg Oral Daily Shalhoub, Sherryll Burger, MD   40 mg at 03/18/20 1110  . polyethylene glycol (MIRALAX / GLYCOLAX) packet 17 g  17 g Oral Daily PRN Shalhoub, Sherryll Burger, MD      . traMADol Veatrice Bourbon) tablet 50 mg  50 mg Oral Q6H PRN Shalhoub, Sherryll Burger, MD       Or  . traMADol Veatrice Bourbon) tablet 100 mg  100 mg Oral Q6H PRN Shalhoub, Sherryll Burger, MD         Discharge Medications: Please see discharge summary for a list of discharge medications.  Relevant Imaging Results:  Relevant Lab Results:   Additional Information ssn: [id number]  Lia Hopping, LCSW

## 2020-03-18 NOTE — TOC Initial Note (Addendum)
Transition of Care [facility]) - Initial/Assessment Note    Patient Details  Name: Brandy Morris MRN: [id number] Date of Birth: [birth year]  Transition of Care [facility]) CM/SW Contact:    Lia Hopping, [place] Phone Number: 03/18/2020, 3:00 PM  Clinical Narrative:                 CSW met wit with the patient at bedside to discuss discharge plan to discuss rehab SNF. Patient agreeable. Patient reports she lives at home with her spouse  Patient uses a walker to ambulate. She prepares meals, bathes and dresses herself. She reports he spouse assist with cooking as well. She reports her adult children assist with taking her to appointments and grocery shopping. Patient daughter takes her to [facility] wound [facility] center every Monday for leg wound care.  Patient reports she does wear oxygen at night and has the equipment at home.  CSW discuss SNF placement and later following up with bed offers. Patient reports understanding.     Expected Discharge Plan: Skilled Nursing Facility Barriers to Discharge: Continued Medical Work up   Patient Goals and CMS Choice Patient states their goals for this hospitalization and ongoing recovery are:: Have less pain in my back CMS Medicare.gov Compare Post Acute Care list provided to:: Patient Choice offered to / list presented to : Patient  Expected Discharge Plan and Services Expected Discharge Plan: [place] In-house Referral: NA Discharge Planning Services: NA Post Acute Care Choice: [place] Living arrangements for the past 2 months: Single Family Home                 DME Arranged: N/A DME Agency: NA       HH Arranged: NA [place] Agency: NA        Prior Living Arrangements/Services Living arrangements for the past 2 months: Single Family Home Lives with:: Spouse Patient language and need for interpreter reviewed:: No Do you feel safe going back to the place where you live?: Yes      Need for Family Participation in  Patient Care: Yes (Comment) Care giver support system in place?: Yes (comment) Current home services: DME Criminal Activity/Legal Involvement Pertinent to Current Situation/Hospitalization: No - Comment as needed  Activities of Daily Living Home Assistive Devices/Equipment: CPAP, CBG Meter, Eyeglasses, Walker (specify type), Bedside commode/3-in-1(3 wheeled walker) ADL Screening (condition at time of admission) Patient's cognitive ability adequate to safely complete daily activities?: Yes Is the patient deaf or have difficulty hearing?: No Does the patient have difficulty seeing, even when wearing glasses/contacts?: No Does the patient have difficulty concentrating, remembering, or making decisions?: No Patient able to express need for assistance with ADLs?: Yes Does the patient have difficulty dressing or bathing?: Yes Independently performs ADLs?: No Communication: Independent Dressing (OT): Needs assistance Is this a change from baseline?: Pre-admission baseline Grooming: Needs assistance Is this a change from baseline?: Pre-admission baseline Feeding: Needs assistance Is this a change from baseline?: Pre-admission baseline Bathing: Needs assistance(sits on bedside commode that is put in the shower) Is this a change from baseline?: Pre-admission baseline Toileting: Needs assistance Is this a change from baseline?: Pre-admission baseline In/Out Bed: Needs assistance Is this a change from baseline?: Pre-admission baseline Walks in Home: Needs assistance Is this a change from baseline?: Pre-admission baseline Does the patient have difficulty walking or climbing stairs?: Yes(secondary to hip pain) Weakness of Legs: Both Weakness of Arms/Hands: None  Permission Sought/Granted Permission sought to share information with : Case Manager Permission granted  to share information with : Yes, Verbal Permission Granted     Permission granted to share info w AGENCY: Skilled Nursing  Facility        Emotional Assessment Appearance:: Appears stated age Attitude/Demeanor/Rapport: Engaged Affect (typically observed): Pleasant, Accepting Orientation: : Oriented to Self, Oriented to Place, Oriented to  Time, Oriented to Situation Alcohol / Substance Use: Not Applicable Psych Involvement: No (comment)  Admission diagnosis:  Peripheral edema [R60.9] AKI (acute kidney injury) ([place]) [N17.9] Abnormal LFTs [R94.5] Intractable back pain [M54.9] Acute left-sided low back pain without sciatica [M54.5] Patient Active Problem List   Diagnosis Date Noted  . Chronic kidney disease, stage 3a 03/18/2020  . Intractable back pain 03/18/2020  . Lymphedema of both lower extremities 03/18/2020  . Bilateral pulmonary infiltrates on chest x-ray 03/18/2020  . Pressure injury of skin 11/28/2019  . GERD (gastroesophageal reflux disease) 11/27/2019  . Hypothyroidism 11/27/2019  . Type 2 diabetes mellitus with stage 3 chronic kidney disease, without long-term current use of insulin ([place]) 11/27/2019  . Essential hypertension 11/27/2019   PCP:  Karleen Hampshire., MD Pharmacy:   [id number] [place], [place] [id number]: [phone]: [phone]    Social Determinants of Health (SDOH) Interventions    Readmission Risk Interventions No flowsheet data found.

## 2020-03-18 NOTE — ED Notes (Signed)
Pt transported to CT ?

## 2020-03-18 NOTE — Evaluation (Signed)
Physical Therapy Evaluation Patient Details Name: Brandy Morris MRN: [id number] DOB: 05/28/1935 Today's Date: 03/18/2020   History of Present Illness  85 year old female with past medical history of diabetes mellitus type 2, hypertension, morbid obesity, lymphedema of the lower extremities, gastroesophageal reflux disease, hypothyroidism, chronic kidney disease stage IIIa and recent COVID-19 infection in January 2021 who presents to [facility] emergency department with complaints of low back pain  Clinical Impression  The patient states" I just want to be left alone." Encouraged patient to participate in order to determine DC needs and to return to her previous function where she was able to ambulate a short distance PTA. Patient required 2 total assist to sit up on the bed edge and max assist to stand at [facility] lift equipment x 1. Patient currently has limited physical assistance at home, spouse has PD and has balance issues per pt. Discussed options: HH vs SNF. Recommend SNF at this time.Patient agreeable to consider SNF but would rather go home.  Discussed with patient the difficulty with mobility with PT , requiring  2 assist currently with limited assistance at home.  Pt admitted with above diagnosis.  Pt currently with functional limitations due to the deficits listed below (see PT Problem List). Pt will benefit from skilled PT to increase their independence and safety with mobility to allow discharge to the venue listed below.       Follow Up Recommendations SNF;Supervision/Assistance - 24 hour    Equipment Recommendations  None recommended by PT    Recommendations for Other Services OT consult     Precautions / Restrictions Precautions Precautions: Fall Precaution Comments: back pain, urinary urgency      Mobility  Bed Mobility Overal bed mobility: Needs Assistance Bed Mobility: Supine to Sit;Sit to Supine     Supine to sit: Total assist;+2 for physical  assistance;+2 for safety/equipment Sit to supine: Total assist;+2 for physical assistance;+2 for safety/equipment   General bed mobility comments: patient required total assist of 2 to move legs and sit up on edge. Total to return to supine. Patient sleeps in lift chair PTA  Transfers Overall transfer level: Needs assistance   Transfers: Sit to/from Stand Sit to Stand: Mod assist;+2 safety/equipment;+2 physical assistance;From elevated surface         General transfer comment: Assist to place RUE on bar. Stands with mod assist.Draw sheet replaced, patient moved toward [facility] while standing in STEDY. Close guarding to sit down due to patient's habitus and  short stature.  Ambulation/Gait                Stairs            Wheelchair Mobility    Modified Rankin (Stroke Patients Only)       Balance Overall balance assessment: Needs assistance Sitting-balance support: Feet supported;Bilateral upper extremity supported Sitting balance-Leahy Scale: Poor Sitting balance - Comments: tends to lean posterior, body habitus does tend to cause posterior lean, Postural control: Posterior lean   Standing balance-Leahy Scale: Poor Standing balance comment: reliant on BUE and use of STEDy lift                             Pertinent Vitals/Pain Pain Assessment: Faces Faces Pain Scale: Hurts worst Pain Location: in low back when rolled and assisted sitting up Pain Descriptors / Indicators: Discomfort;Moaning;Cramping Pain Intervention(s): Limited activity within patient's tolerance;Monitored during session;Premedicated before session    Home Living Family/patient expects  to be discharged to:: Private residence Living Arrangements: Spouse/significant other Available Help at Discharge: Available PRN/intermittently Type of Home: House Home Access: Helix: One [place]: Bedside commode(3 wheeled RW) Additional Comments: sleeps in  lift chair. spuse has PD and is physically unable to assist patient, family not available    Prior Function Level of Independence: Needs assistance   Gait / Transfers Assistance Needed: basic ambulates to BR  with 3 wheeled( about 40').  ADL's / Homemaking Assistance Needed: sponge bathes        Hand Dominance        Extremity/Trunk Assessment   Upper Extremity Assessment Upper Extremity Assessment: RUE deficits/detail RUE Deficits / Details: decreased elevation, pain to move. LUE Sensation: WNL    Lower Extremity Assessment Lower Extremity Assessment: RLE deficits/detail;LLE deficits/detail RLE Deficits / Details: unna boot in place, lifts  leg  a few inches from bed, knee flexion at about 90 when sitting LLE Deficits / Details: similar to right., slightly less strength and ROM at knee       Communication      Cognition Arousal/Alertness: Awake/alert Behavior During Therapy: WFL for tasks assessed/performed Overall Cognitive Status: Within Functional Limits for tasks assessed                                        General Comments      Exercises     Assessment/Plan    PT Assessment Patient needs continued PT services  PT Problem List Decreased strength;Decreased balance;Decreased knowledge of precautions;Decreased range of motion;Decreased mobility;Obesity;Decreased knowledge of use of DME;Decreased activity tolerance;Decreased skin integrity;Pain       PT Treatment Interventions DME instruction;Functional mobility training;Patient/family education;Balance training;Gait training;Therapeutic activities;Therapeutic exercise    PT Goals (Current goals can be found in the Care Plan section)  Acute Rehab PT Goals Patient Stated Goal: I want to go home, My husband will miss me PT Goal Formulation: With patient Time For Goal Achievement: 04/01/20 Potential to Achieve Goals: Fair    Frequency Min 2X/week   Barriers to discharge Decreased  caregiver support spouse unable to assist, family not available    Co-evaluation               AM-PAC PT "6 Clicks" Mobility  Outcome Measure Help needed turning from your back to your side while in a flat bed without using bedrails?: Total Help needed moving from lying on your back to sitting on the side of a flat bed without using bedrails?: Total Help needed moving to and from a bed to a chair (including a wheelchair)?: Total Help needed standing up from a chair using your arms (e.g., wheelchair or bedside chair)?: Total Help needed to walk in hospital room?: Total Help needed climbing 3-5 steps with a railing? : Total 6 Click Score: 6    End of Session Equipment Utilized During Treatment: Gait belt Activity Tolerance: Patient limited by pain Patient left: in bed;with call bell/phone within reach;with nursing/sitter in room Nurse Communication: Mobility status PT Visit Diagnosis: Unsteadiness on feet (R26.81);Difficulty in walking, not elsewhere classified (R26.2);Pain    Time: 0910-0958 PT Time Calculation (min) (ACUTE ONLY): 48 min   Charges:   PT Evaluation $PT Eval Moderate Complexity: 1 Mod PT Treatments $Therapeutic Activity: 23-37 mins        Tresa Endo PT Acute Rehabilitation Services Pager [phone] Office [phone]  Rada Hay 03/18/2020, 10:12 AM

## 2020-03-18 NOTE — Consult Note (Signed)
WOC Nurse Consult Note: Reason for Consult:Chronic venous insufficiency and lymphedema.  Wears weekly Unna boots.  Breakdown to left lateral malleolus and weeping to right anterior lower leg. Wound type:venous insufficiency and lymphedema Pressure Injury POA: NA Measurement: left malleolus:  2 cm x 2 cm x 0.1 cm denuded skin with scattered intact skin within.  Tender to touch Right anterior lower leg:  4 cm x 5 cm intact weeping erythema Wound OHY:WVPX and moist Drainage (amount, consistency, odor) moderate weeping  No odor.  Periwound:edema and chronic skin changes. Dressing procedure/placement/frequency: Bedside RN to perform:  Cleanse legs with soap and water and pat dry.  Apply aquacel to left lateral malleolus and erythema to right anterior lower leg.Ortho tech to apply [employer] wrapping from below toes to below knee.  Will not follow at this time.  Please re-consult if needed.  Maple Hudson MSN, RN, FNP-BC [facility]ON Wound, Ostomy, Continence Nurse Pager [phone]

## 2020-03-18 NOTE — Progress Notes (Signed)
PROGRESS NOTE    [name]  WJX:[id number] DOB: Dec 06, 1934 DOA: 03/17/2020 PCP: [name], MD    Brief Narrative:  Brandy Morris 85 year old Caucasian female with past medical history remarkable for type 2 diabetes mellitus, essential hypertension, bilateral lower extremity lymphedema, morbid obesity, GERD, hypothyroidism, CKD stage IIIa, recent Covid-19 infection January 2021 who presented to the ED with complaints of low back pain.  Onset reportedly 2 weeks ago, sharp in quality with nonradiation.  Pain is worse with movement and improved with rest.  No loss of bowel or bladder control and no paresthesias.  No lower extremity weakness; but typically ambulates using a walker at baseline.  Patient further denies any dysuria, no abdominal pain, no diarrhea, no fevers, no nausea, no vomiting, no vaginal discharge or sick contacts.  Her symptoms continue to worsen to the point where she was having difficulty ambulating and inability to leave her house due to the severity of her pain.   Assessment & Plan:   Active Problems:   GERD (gastroesophageal reflux disease)   Hypothyroidism   Type 2 diabetes mellitus with stage 3 chronic kidney disease, without long-term current use of insulin (HCC)   Essential hypertension   Chronic kidney disease, stage 3a   Intractable back pain   Lymphedema of both lower extremities   Bilateral pulmonary infiltrates on chest x-ray   Intractable low back pain Degenerative disc disease Lumbar spinal canal stenosis Presenting with intractable low back pain progressing over the past 2 weeks.  No weakness or paresthesias.  Now with progressive difficulty ambulating.  CT T/L-spine notable for generalized osteopenia, severe multilevel degenerative disc disease with bulky osteophytes, multilevel moderate/severe lumbar spinal canal stenosis. --Tramadol 50-100mg  q6h prn for moderate/severe pain --PT recommends SNF placement, social work for  coordination --Outpatient follow-up with neurosurgery for further evaluation of degenerative disc disease with moderate/severe lumbar spinal canal stenosis  Bilateral pulmonary infiltrates on chest x-ray Bilateral lower extremity lymphedema Multiple pulmonary nodules Chest x-ray notable for bilateral patchy infiltrates.  Patient with history of bilateral lower extremity lymphedema.  Recent Covid-19 infection January 2021, repeat Covid-19 PCR admission negative.  CT chest with multiple bilateral lung nodules and small patchy atelectasis versus infiltrate left lower lobe.  Patient is afebrile. --Wound care following, appreciate assistance --Reapply Unna boot --Lasix 40 mg IV twice daily --Recommend repeat CT chest 30-month follow-up for lung nodule surveillance  Hypothyroidism TSH elevated 5.881.  Free T4 1.35. --Continue levothyroxine 150  Mcg p.o. daily  CKD stage IIIa Creatinine 1.41, stable. --Continue monitor renal function closely daily especially in the setting of aggressive IV diuresis above  Type 2 diabetes mellitus On glimepiride 1 mg p.o. daily at home.  5.8 on 03/18/2020, well controlled. --Continue moderate dose insulin sliding scale for coverage --CBG's before every meal/at bedtime  Essential hypertension --Continue lisinopril 40 mg p.o. daily, 12.5 mg p.o. nightly, amlodipine 5 mg p.o. daily  Morbid obesity Discussed with patient needs for aggressive lifestyle changes, weight loss as this complicates all facets of care.  GERD: Continue Protonix 40mg  p.o. daily  History of Covid-19 viral pneumonia Patient with history of Covid-19 viral pneumonia in January 2021.  Utilizes 2 L nasal cannula at baseline nocturnally.  Chest x-ray with diffuse patchy infiltrates, CT chest with multiple lung nodules and atelectasis versus infiltrate left lower lobe. --Continue supplemental oxygen, titrate to maintain SPO2 greater than 92%   DVT prophylaxis: Lovenox Code Status: Full  code Family Communication: Discussed with patient extensively at bedside  Disposition  Plan:  Status is: Observation  The patient remains OBS appropriate and will d/c before 2 midnights.  Dispo: The patient is from: Home              Anticipated d/c is to: SNF              Anticipated d/c date is: 1 day              Patient currently is not medically stable to d/c.   Consultants:   None  Procedures:   None  Antimicrobials:   None   Subjective: Patient seen and examined at bedside, still holding in the ED.  Back pain currently improved, has not utilized any oral medicines as of yet.  Did receive IV fentanyl overnight.  Continues with bilateral lymphedema, she reports no significant change for some time.  Patient understands and is concerned about returning home as she has to care for her husband who has significant Parkinson's disease.  No other complaints or concerns at this time.  Denies headache, no fever/chills/night sweats, no nausea cefonicid/diarrhea, no chest pain, no palpitations, no shortness of breath, no abdominal pain.  No acute events overnight per nursing staff.  Objective: Vitals:   03/18/20 0915 03/18/20 0930 03/18/20 1140 03/18/20 1326  BP: (!) 141/77 (!) 153/48 (!) 153/43 (!) 138/48  Pulse: 65 75 67 69  Resp: 18 18 15 16   Temp:   97.6 F (36.4 C) (!) 97.1 F (36.2 C)  TempSrc:      SpO2: 100% 96% 97% 99%    Intake/Output Summary (Last 24 hours) at 03/18/2020 1654 Last data filed at 03/18/2020 1654 Gross per 24 hour  Intake 51.02 ml  Output 2350 ml  Net -2298.98 ml   There were no vitals filed for this visit.  Examination:  General exam: Appears calm and comfortable, obese Respiratory system: Clear to auscultation. Respiratory effort normal.  On 2 L nasal cannula (2L nocturnal at baseline) Cardiovascular system: S1 & S2 heard, RRR. No JVD, murmurs, rubs, gallops or clicks. 2+ pitting pedal edema laterally. Gastrointestinal system: Abdomen is  nondistended, soft and nontender. No organomegaly or masses felt. Normal bowel sounds heard. Central nervous system: Alert and oriented. No focal neurological deficits. Extremities: Symmetric 5 x 5 power. Skin: Thickened erythematous skin bilateral lower extremities Psychiatry: Judgement and insight appear normal. Mood & affect appropriate.     Data Reviewed: I have personally reviewed following labs and imaging studies  CBC: Recent Labs  Lab 03/17/20 2331 03/18/20 0457  WBC 11.2* 10.4  NEUTROABS 6.7 6.4  HGB 12.0 12.0  HCT 38.7 38.4  MCV 99.0 99.0  PLT 244 229   Basic Metabolic Panel: Recent Labs  Lab 03/17/20 2331 03/18/20 0457  NA 140 141  K 5.0 5.0  CL 100 100  CO2 31 33*  GLUCOSE 90 102*  BUN 30* 28*  CREATININE 1.40* 1.41*  CALCIUM 9.2 9.2  MG  --  2.3   GFR: CrCl cannot be calculated (Unknown ideal weight.). Liver Function Tests: Recent Labs  Lab 03/17/20 2331 03/18/20 0457  AST 36 37  ALT 23 24  ALKPHOS 304* 304*  BILITOT 0.8 0.9  PROT 6.9 6.9  ALBUMIN 3.1* 2.9*   No results for input(s): LIPASE, AMYLASE in the last 168 hours. No results for input(s): AMMONIA in the last 168 hours. Coagulation Profile: No results for input(s): INR, PROTIME in the last 168 hours. Cardiac Enzymes: No results for input(s): CKTOTAL, CKMB, CKMBINDEX, TROPONINI in the last  168 hours. BNP (last 3 results) No results for input(s): PROBNP in the last 8760 hours. HbA1C: Recent Labs    03/18/20 0457  HGBA1C 5.8*   CBG: Recent Labs  Lab 03/18/20 0743 03/18/20 1159 03/18/20 1538 03/18/20 1646  GLUCAP 80 98 84 83   Lipid Profile: No results for input(s): CHOL, HDL, LDLCALC, TRIG, CHOLHDL, LDLDIRECT in the last 72 hours. Thyroid Function Tests: Recent Labs    03/18/20 0457  TSH 5.881*  FREET4 1.35*   Anemia Panel: No results for input(s): VITAMINB12, FOLATE, FERRITIN, TIBC, IRON, RETICCTPCT in the last 72 hours. Sepsis Labs: No results for input(s):  PROCALCITON, LATICACIDVEN in the last 168 hours.  Recent Results (from the past 240 hour(s))  SARS CORONAVIRUS 2 (TAT 6-24 HRS) Nasopharyngeal Nasopharyngeal Swab     Status: None   Collection Time: 03/18/20  2:27 AM   Specimen: Nasopharyngeal Swab  Result Value Ref Range Status   SARS Coronavirus 2 NEGATIVE NEGATIVE Final    Comment: (NOTE) SARS-CoV-2 target nucleic acids are NOT DETECTED. The SARS-CoV-2 RNA is generally detectable in upper and lower respiratory specimens during the acute phase of infection. Negative results do not preclude SARS-CoV-2 infection, do not rule out co-infections with other pathogens, and should not be used as the sole basis for treatment or other patient management decisions. Negative results must be combined with clinical observations, patient history, and epidemiological information. The expected result is Negative. Fact Sheet for Patients: HairSlick.no Fact Sheet for Healthcare Providers: quierodirigir.com This test is not yet approved or cleared by the [place] FDA and  has been authorized for detection and/or diagnosis of SARS-CoV-2 by FDA under an Emergency Use Authorization (EUA). This EUA will remain  in effect (meaning this test can be used) for the duration of the COVID-19 declaration under Section 56 4(b)(1) of the Act, 21 U.S.C. section 360bbb-3(b)(1), unless the authorization is terminated or revoked sooner. Performed at [facility], [address] [place], [place] [address]          Radiology Studies: CT CHEST WO CONTRAST  Result Date: 03/18/2020 CLINICAL DATA:  Pain from the left shoulder bleed to the left hip for 2 weeks. EXAM: CT CHEST WITHOUT CONTRAST TECHNIQUE: Multidetector CT imaging of the chest was performed following the standard protocol without IV contrast. COMPARISON:  November 27, 2019 FINDINGS: Cardiovascular: There is moderate severity calcification of  the aortic arch. Normal heart size. No pericardial effusion. Marked severity coronary artery calcification is seen. Mediastinum/Nodes: No enlarged mediastinal or axillary lymph nodes. Lungs/Pleura: A new 5 mm noncalcified lung nodule is seen within the anterolateral aspect of the right upper lobe (axial CT image 55, CT series number 5). 6 mm and 7 mm noncalcified lung nodule seen within the posterior aspect of the left apex. This represents a new finding when compared to the prior exam. A stable 6 mm noncalcified lung nodule is seen within the posterior aspect of the left lower lobe (axial CT image 70, CT series number 5). A 6 mm noncalcified lung nodule is seen within the anterolateral aspect of the left lower lobe (axial CT image 82, CT series number 5). This represents a new finding when compared to the prior study. Stable 4 mm and 5 mm noncalcified lung nodules are seen within the right middle lobe. A small patchy area of atelectasis and/or early infiltrate is seen within the posteromedial aspect of the left lower lobe. There is no evidence of a pleural effusion or pneumothorax. Upper Abdomen:  There is a moderate-sized hiatal hernia. Musculoskeletal: Multilevel degenerative changes seen throughout the thoracic spine. IMPRESSION: 1. Multiple new subcentimeter bilateral noncalcified lung nodules. Correlation with 3 month follow-up chest CT is recommended to determine stability. 2. Small patchy area of atelectasis and/or early infiltrate within the posteromedial aspect of the left lower lobe. 3. Moderate-sized hiatal hernia. Aortic Atherosclerosis (ICD10-I70.0). Electronically Signed   By: Virgina Norfolk M.D.   On: 03/18/2020 04:06   CT THORACIC SPINE WO CONTRAST  Result Date: 03/18/2020 CLINICAL DATA:  Back pain EXAM: CT THORACIC AND LUMBAR SPINE WITHOUT CONTRAST TECHNIQUE: Multidetector CT imaging of the thoracic and lumbar spine was performed without contrast. Multiplanar CT image reconstructions were  also generated. COMPARISON:  CTA chest 11/27/2019, CT abdomen pelvis 08/08/2019 FINDINGS: CT THORACIC SPINE FINDINGS Alignment: Mild right convex asymmetry Vertebrae: Bones are diffusely osteopenic. At the T11 and T12 levels, there is lucency of the anterior vertebral bodies. Paraspinal and other soft tissues: Please see report for dedicated CT of the chest. Disc levels: There is no bony spinal canal stenosis. CT LUMBAR SPINE FINDINGS Segmentation: 5 lumbar type vertebrae. Alignment: Grade 1 retrolisthesis at L3-4 and grade 1 anterolisthesis at L4-5. Mild left convex asymmetry. Vertebrae: Osteopenia. Bulky osteophytes at L1-2, L2-3 and L3-4. There are large areas of lucency within the posterior aspect of the L4 and L5 vertebrae. Paraspinal and other soft tissues: Calcific aortic atherosclerosis. Disc levels: L1-2: Partially calcified disc material and endplate spurring with mild spinal canal stenosis. L2-3: Endplate spurring with mild spinal canal stenosis. L3-4: Severe facet hypertrophy. Disc bulge with endplate spurring. Severe spinal canal stenosis. L4-5: Severe facet hypertrophy with disc bulge and endplate spurring. Moderate spinal canal stenosis. L5-S1: No bony spinal canal stenosis. IMPRESSION: 1. Generalized osteopenia with multiple areas of somewhat focal demineralization. This appearance is exaggerated by photon starvation artifacts due to patient body habitus. Within these limitations, no acute fracture. 2. Severe multilevel degenerative disc disease with bulky osteophytosis causing fusion of long segments of the lower thoracic and lumbar spine. 3. Multilevel moderate-to-severe lumbar spinal canal stenosis. 4.  Aortic atherosclerosis (ICD10-I70.0). Electronically Signed   By: Ulyses Jarred M.D.   On: 03/18/2020 04:04   CT LUMBAR SPINE WO CONTRAST  Result Date: 03/18/2020 CLINICAL DATA:  Back pain EXAM: CT THORACIC AND LUMBAR SPINE WITHOUT CONTRAST TECHNIQUE: Multidetector CT imaging of the thoracic  and lumbar spine was performed without contrast. Multiplanar CT image reconstructions were also generated. COMPARISON:  CTA chest 11/27/2019, CT abdomen pelvis 08/08/2019 FINDINGS: CT THORACIC SPINE FINDINGS Alignment: Mild right convex asymmetry Vertebrae: Bones are diffusely osteopenic. At the T11 and T12 levels, there is lucency of the anterior vertebral bodies. Paraspinal and other soft tissues: Please see report for dedicated CT of the chest. Disc levels: There is no bony spinal canal stenosis. CT LUMBAR SPINE FINDINGS Segmentation: 5 lumbar type vertebrae. Alignment: Grade 1 retrolisthesis at L3-4 and grade 1 anterolisthesis at L4-5. Mild left convex asymmetry. Vertebrae: Osteopenia. Bulky osteophytes at L1-2, L2-3 and L3-4. There are large areas of lucency within the posterior aspect of the L4 and L5 vertebrae. Paraspinal and other soft tissues: Calcific aortic atherosclerosis. Disc levels: L1-2: Partially calcified disc material and endplate spurring with mild spinal canal stenosis. L2-3: Endplate spurring with mild spinal canal stenosis. L3-4: Severe facet hypertrophy. Disc bulge with endplate spurring. Severe spinal canal stenosis. L4-5: Severe facet hypertrophy with disc bulge and endplate spurring. Moderate spinal canal stenosis. L5-S1: No bony spinal canal stenosis. IMPRESSION: 1. Generalized osteopenia with multiple  areas of somewhat focal demineralization. This appearance is exaggerated by photon starvation artifacts due to patient body habitus. Within these limitations, no acute fracture. 2. Severe multilevel degenerative disc disease with bulky osteophytosis causing fusion of long segments of the lower thoracic and lumbar spine. 3. Multilevel moderate-to-severe lumbar spinal canal stenosis. 4.  Aortic atherosclerosis (ICD10-I70.0). Electronically Signed   By: Deatra RobinsonKevin  Herman M.D.   On: 03/18/2020 04:04   DG Chest Portable 1 View  Result Date: 03/17/2020 CLINICAL DATA:  Peripheral edema EXAM:  PORTABLE CHEST 1 VIEW COMPARISON:  January 15, 2020 FINDINGS: The heart size and mediastinal contours are within normal limits. Mildly increased interstitial markings are seen throughout both lungs. There is also slightly patchy airspace opacity seen within the right upper lobe. Aortic knob calcifications are seen. No pleural effusion. No acute osseous abnormality. IMPRESSION: Interstitial/patchy airspace opacity within both lungs which could be due to edema and/or infectious etiology. Electronically Signed   By: Jonna ClarkBindu  Avutu M.D.   On: 03/17/2020 23:20   ECHOCARDIOGRAM COMPLETE  Result Date: 03/18/2020    ECHOCARDIOGRAM REPORT   Patient Name:   Julieanne MansonLILLIE R Hejl Date of Exam: 03/18/2020 Medical Rec #:  [id number]        Height:       62.0 in Accession #:    [id number]       Weight:       274.7 lb Date of Birth:  Oct 31, 1935       BSA:          2.188 m Patient Age:    84 years         BP:           138/48 mmHg Patient Gender: F                HR:           69 bpm. Exam Location:  Inpatient Procedure: 2D Echo, Cardiac Doppler and Color Doppler Indications:    CHF  History:        Patient has no prior history of Echocardiogram examinations. LE                 edema; Risk Factors:Hypertension, Morbid obesity and Diabetes.                 Recent COVID,.  Sonographer:    Lavenia AtlasBrooke Strickland Referring Phys: [id number] Marinda ElkGEORGE J SHALHOUB  Sonographer Comments: Patient is morbidly obese. IMPRESSIONS  1. Normal LV systolic function; grade 1 diastolic dysfunction.  2. Left ventricular ejection fraction, by estimation, is 60 to 65%. The left ventricle has normal function. The left ventricle has no regional wall motion abnormalities. There is moderate left ventricular hypertrophy. Left ventricular diastolic parameters are consistent with Grade I diastolic dysfunction (impaired relaxation). Elevated left atrial pressure.  3. Right ventricular systolic function is normal. The right ventricular size is normal.  4. The mitral valve is  normal in structure. No evidence of mitral valve regurgitation. No evidence of mitral stenosis.  5. The aortic valve is normal in structure. Aortic valve regurgitation is not visualized. No aortic stenosis is present.  6. The inferior vena cava is normal in size with greater than 50% respiratory variability, suggesting right atrial pressure of 3 mmHg. FINDINGS  Left Ventricle: Left ventricular ejection fraction, by estimation, is 60 to 65%. The left ventricle has normal function. The left ventricle has no regional wall motion abnormalities. The left ventricular internal cavity size was normal in size. There is  moderate left ventricular hypertrophy. Left ventricular diastolic parameters are consistent with Grade I diastolic dysfunction (impaired relaxation). Elevated left atrial pressure. Right Ventricle: The right ventricular size is normal.Right ventricular systolic function is normal. Left Atrium: Left atrial size was normal in size. Right Atrium: Right atrial size was normal in size. Pericardium: There is no evidence of pericardial effusion. Mitral Valve: The mitral valve is normal in structure. Normal mobility of the mitral valve leaflets. Mild mitral annular calcification. No evidence of mitral valve regurgitation. No evidence of mitral valve stenosis. Tricuspid Valve: The tricuspid valve is normal in structure. Tricuspid valve regurgitation is trivial. No evidence of tricuspid stenosis. Aortic Valve: The aortic valve is normal in structure. Aortic valve regurgitation is not visualized. No aortic stenosis is present. Aortic valve mean gradient measures 9.0 mmHg. Aortic valve peak gradient measures 12.8 mmHg. Aortic valve area, by VTI measures 1.53 cm. Pulmonic Valve: The pulmonic valve was not well visualized. Pulmonic valve regurgitation is not visualized. No evidence of pulmonic stenosis. Aorta: The aortic root is normal in size and structure. Venous: The inferior vena cava is normal in size with greater  than 50% respiratory variability, suggesting right atrial pressure of 3 mmHg.  Additional Comments: Normal LV systolic function; grade 1 diastolic dysfunction.  LEFT VENTRICLE PLAX 2D LVIDd:         3.60 cm  Diastology LVIDs:         2.50 cm  LV e' lateral:   6.22 cm/s LV PW:         1.40 cm  LV E/e' lateral: 21.4 LV IVS:        1.50 cm  LV e' medial:    7.84 cm/s LVOT diam:     1.90 cm  LV E/e' medial:  17.0 LV SV:         73 LV SV Index:   33 LVOT Area:     2.84 cm  RIGHT VENTRICLE RV Basal diam:  3.10 cm RV S prime:     8.70 cm/s TAPSE (M-mode): 2.4 cm LEFT ATRIUM           Index       RIGHT ATRIUM           Index LA diam:      3.20 cm 1.46 cm/m  RA Area:     19.40 cm LA Vol (A2C): 56.5 ml 25.83 ml/m RA Volume:   61.40 ml  28.07 ml/m LA Vol (A4C): 61.0 ml 27.88 ml/m  AORTIC VALVE AV Area (Vmax):    1.77 cm AV Area (Vmean):   1.43 cm AV Area (VTI):     1.53 cm AV Vmax:           179.00 cm/s AV Vmean:          141.000 cm/s AV VTI:            0.476 m AV Peak Grad:      12.8 mmHg AV Mean Grad:      9.0 mmHg LVOT Vmax:         112.00 cm/s LVOT Vmean:        71.200 cm/s LVOT VTI:          0.257 m LVOT/AV VTI ratio: 0.54  AORTA Ao Root diam: 3.10 cm MITRAL VALVE MV Area (PHT): 3.17 cm     SHUNTS MV Decel Time: 239 msec     Systemic VTI:  0.26 m MV E velocity: 133.00 cm/s  Systemic Diam: 1.90 cm MV  A velocity: 139.00 cm/s MV E/A ratio:  0.96 Olga Millers MD Electronically signed by Olga Millers MD Signature Date/Time: 03/18/2020/3:50:18 PM    Final    US Abdomen Limited RUQ  Result Date: 03/18/2020 CLINICAL DATA:  Abnormal liver function tests EXAM: ULTRASOUND ABDOMEN LIMITED RIGHT UPPER QUADRANT COMPARISON:  None. FINDINGS: Gallbladder: Gallbladder is surgically absent Common bile duct: Diameter: 6 mm Liver: Coarsened hepatic echotexture with mildly nodular contour. The liver parenchyma is echogenic. Portal vein is patent on color Doppler imaging with normal direction of blood flow towards the liver.  Other: None. IMPRESSION: Hepatic steatosis and possible cirrhosis. Surgically absent gallbladder without biliary dilatation. Electronically Signed   By: Deatra Robinson M.D.   On: 03/18/2020 03:01        Scheduled Meds: . acetaminophen  650 mg Oral BID  . amLODipine  5 mg Oral QPM  . carvedilol  12.5 mg Oral QHS  . enoxaparin (LOVENOX) injection  40 mg Subcutaneous Q24H  . furosemide  40 mg Intravenous BID  . insulin aspart  0-15 Units Subcutaneous TID AC & HS  . levothyroxine  150 mcg Oral QPM  . lisinopril  40 mg Oral QPM  . pantoprazole  40 mg Oral Daily   Continuous Infusions:   LOS: 0 days    Time spent: 36 minutes spent on chart review, discussion with nursing staff, consultants, updating family and interview/physical exam; more than 50% of that time was spent in counseling and/or coordination of care.    Alvira Philips [place], DO [facility] Available via Epic secure chat 7am-7pm After these hours, please refer to coverage provider listed on [email or web address] 03/18/2020, 4:54 PM

## 2020-03-18 NOTE — ED Notes (Signed)
Enters my care. Report from night RN. Pt resting in stretcher. O2Sat down to 84%RA. A/Ox3. Sts she wears 2LNC at night. 2LNC applied for comfort. Denies SHOB or chest pain. FSBS=80. Breakfast given. Purewick intact. Awaiting 8am meds to be verified from pharmacy. NAD. No complaints. Cont to monitor.

## 2020-03-19 DIAGNOSIS — I890 Lymphedema, not elsewhere classified: Secondary | ICD-10-CM | POA: Diagnosis not present

## 2020-03-19 DIAGNOSIS — E1121 Type 2 diabetes mellitus with diabetic nephropathy: Secondary | ICD-10-CM | POA: Diagnosis not present

## 2020-03-19 DIAGNOSIS — M549 Dorsalgia, unspecified: Secondary | ICD-10-CM | POA: Diagnosis not present

## 2020-03-19 DIAGNOSIS — E039 Hypothyroidism, unspecified: Secondary | ICD-10-CM | POA: Diagnosis not present

## 2020-03-19 LAB — RESPIRATORY PANEL BY RT PCR (FLU A&B, COVID)
Influenza A by PCR: NEGATIVE
Influenza B by PCR: NEGATIVE
SARS Coronavirus 2 by RT PCR: NEGATIVE

## 2020-03-19 LAB — GLUCOSE, CAPILLARY
Glucose-Capillary: 73 mg/dL (ref 70–99)
Glucose-Capillary: 126 mg/dL — ABNORMAL HIGH (ref 70–99)
Glucose-Capillary: 133 mg/dL — ABNORMAL HIGH (ref 70–99)
Glucose-Capillary: 124 mg/dL — ABNORMAL HIGH (ref 70–99)
Glucose-Capillary: 118 mg/dL — ABNORMAL HIGH (ref 70–99)

## 2020-03-19 LAB — BASIC METABOLIC PANEL
Anion gap: 9 (ref 5–15)
BUN: 31 mg/dL — ABNORMAL HIGH (ref 8–23)
CO2: 31 mmol/L (ref 22–32)
Calcium: 8.4 mg/dL — ABNORMAL LOW (ref 8.9–10.3)
Chloride: 97 mmol/L — ABNORMAL LOW (ref 98–111)
Creatinine, Ser: 1.63 mg/dL — ABNORMAL HIGH (ref 0.44–1.00)
GFR calc Af Amer: 34 mL/min — ABNORMAL LOW (ref >60)
GFR calc non Af Amer: 29 mL/min — ABNORMAL LOW (ref >60)
Glucose, Bld: 90 mg/dL (ref 70–99)
Potassium: 4.8 mmol/L (ref 3.5–5.1)
Sodium: 137 mmol/L (ref 135–145)

## 2020-03-19 LAB — CBC
HCT: 35.7 % — ABNORMAL LOW (ref 36.0–46.0)
Hemoglobin: 11 g/dL — ABNORMAL LOW (ref 12.0–15.0)
MCH: 30.6 pg (ref 26.0–34.0)
MCHC: 30.8 g/dL (ref 30.0–36.0)
MCV: 99.4 fL (ref 80.0–100.0)
Platelets: 213 10*3/uL (ref 150–400)
RBC: 3.59 MIL/uL — ABNORMAL LOW (ref 3.87–5.11)
RDW: 13.3 % (ref 11.5–15.5)
WBC: 10.4 10*3/uL (ref 4.0–10.5)
nRBC: 0 % (ref 0.0–0.2)

## 2020-03-19 LAB — MAGNESIUM: Magnesium: 2 mg/dL (ref 1.7–2.4)

## 2020-03-19 NOTE — Progress Notes (Addendum)
PROGRESS NOTE    [name]  WUJ:[id number] DOB: [birth year] DOA: 03/17/2020 PCP: [name], MD    Brief Narrative:  Brandy Morris 85 year old Caucasian female with past medical history remarkable for type 2 diabetes mellitus, essential hypertension, bilateral lower extremity lymphedema, morbid obesity, GERD, hypothyroidism, CKD stage IIIa, recent Covid-19 infection January 2021 who presented to the ED with complaints of low back pain.  Onset reportedly 2 weeks ago, sharp in quality with nonradiation.  Pain is worse with movement and improved with rest.  No loss of bowel or bladder control and no paresthesias.  No lower extremity weakness; but typically ambulates using a walker at baseline.  Patient further denies any dysuria, no abdominal pain, no diarrhea, no fevers, no nausea, no vomiting, no vaginal discharge or sick contacts.  Her symptoms continue to worsen to the point where she was having difficulty ambulating and inability to leave her house due to the severity of her pain.   Assessment & Plan:   Active Problems:   GERD (gastroesophageal reflux disease)   Hypothyroidism   Type 2 diabetes mellitus with stage 3 chronic kidney disease, without long-term current use of insulin (HCC)   Essential hypertension   Chronic kidney disease, stage 3a   Intractable back pain   Lymphedema of both lower extremities   Bilateral pulmonary infiltrates on chest x-ray   Intractable low back pain Degenerative disc disease Lumbar spinal canal stenosis Presenting with intractable low back pain progressing over the past 2 weeks.  No weakness or paresthesias.  Now with progressive difficulty ambulating.  CT T/L-spine notable for generalized osteopenia, severe multilevel degenerative disc disease with bulky osteophytes, multilevel moderate/severe lumbar spinal canal stenosis. --Tramadol 50-100mg  q6h prn for moderate/severe pain --PT recommends SNF placement, social work for  coordination --Outpatient follow-up with neurosurgery for further evaluation of degenerative disc disease with moderate/severe lumbar spinal canal stenosis  Bilateral pulmonary infiltrates on chest x-ray Bilateral lower extremity lymphedema Multiple pulmonary nodules Chest x-ray notable for bilateral patchy infiltrates.  Patient with history of bilateral lower extremity lymphedema.  Recent Covid-19 infection January 2021, repeat Covid-19 PCR admission negative.  CT chest with multiple bilateral lung nodules and small patchy atelectasis versus infiltrate left lower lobe.  Patient is afebrile. --Wound care following, appreciate assistance --Unna boot reapplied by Ortho tech on 03/18/2020 --Recommend repeat CT chest 86-month follow-up for lung nodule surveillance  Hypothyroidism TSH elevated 5.881.  Free T4 1.35. --Continue levothyroxine 150  Mcg p.o. daily  CKD stage IIIa Creatinine 1.41, stable. --Continue monitor renal function closely daily especially in the setting of aggressive IV diuresis above  Type 2 diabetes mellitus On glimepiride 1 mg p.o. daily at home.  5.8 on 03/18/2020, well controlled. --Continue moderate dose insulin sliding scale for coverage --CBG's before every meal/at bedtime  Essential hypertension BP 105/48, well controlled --Continue lisinopril 40 mg p.o. daily, coreg12.5 mg p.o. nightly, amlodipine 5 mg p.o. daily  Morbid obesity Discussed with patient needs for aggressive lifestyle changes, weight loss as this complicates all facets of care.  GERD: Continue Protonix  p.o. daily  History of Covid-19 viral pneumonia Patient with history of Covid-19 viral pneumonia in January 2021.  Utilizes 2 L nasal cannula at baseline nocturnally.  Chest x-ray with diffuse patchy infiltrates, CT chest with multiple lung nodules and atelectasis versus infiltrate left lower lobe. --Continue supplemental oxygen, titrate to maintain SPO2 greater than 92%  Pressure injury,  right gluteal region; present on admission Pressure Injury 11/28/19 Buttocks Right Stage 2 -  Partial thickness loss of dermis presenting as a shallow open injury with a red, pink wound bed without slough. Two dime size wounds in the folds of her right butt cheek. (Active)  11/28/19 0915  Location: Buttocks  Location Orientation: Right  Staging: Stage 2 -  Partial thickness loss of dermis presenting as a shallow open injury with a red, pink wound bed without slough.  Wound Description (Comments): Two dime size wounds in the folds of her right butt cheek.  Present on Admission:    --Continue wound care and offloading      DVT prophylaxis: Lovenox Code Status: Full code Family Communication: Discussed with patient extensively at bedside  Disposition Plan:  Status is: Observation  The patient remains OBS appropriate and will d/c before 2 midnights.  Dispo: The patient is from: Home              Anticipated d/c is to: SNF              Anticipated d/c date is: 1 day              Patient currently is medically stable to d/c.   Consultants:   None  Procedures:   None  Antimicrobials:   None   Subjective: Patient seen and examined at bedside, resting comfortably.  States her pain has been well controlled with use of tramadol.  No other specific complaints this morning.  Awaiting SNF placement. Denies headache, no fever/chills/night sweats, no nausea cefonicid/diarrhea, no chest pain, no palpitations, no shortness of breath, no abdominal pain.  No acute events overnight per nursing staff.  Objective: Vitals:   03/18/20 1326 03/18/20 1740 03/18/20 2117 03/19/20 0636  BP: (!) 138/48 135/66 (!) 101/53 (!) 105/48  Pulse: 69 68 72 64  Resp: 16 16 18 18   Temp: (!) 97.1 F (36.2 C) 98 F (36.7 C) 98.4 F (36.9 C) 98.3 F (36.8 C)  TempSrc:  Oral Oral Oral  SpO2: 99% 100% 97% 96%  Weight:    124.6 kg  Height:    5\' 2"  (1.575 m)    Intake/Output Summary (Last 24 hours) at  03/19/2020 1104 Last data filed at 03/19/2020 1015 Gross per 24 hour  Intake 940 ml  Output 4050 ml  Net -3110 ml   Filed Weights   03/19/20 0636  Weight: 124.6 kg    Examination:  General exam: Appears calm and comfortable, obese Respiratory system: Clear to auscultation. Respiratory effort normal.  On 2 L nasal cannula (2L nocturnal at baseline) Cardiovascular system: S1 & S2 heard, RRR. No JVD, murmurs, rubs, gallops or clicks. 2+ pitting pedal edema laterally. Gastrointestinal system: Abdomen is nondistended, soft and nontender. No organomegaly or masses felt. Normal bowel sounds heard. Central nervous system: Alert and oriented. No focal neurological deficits. Extremities: Symmetric 5 x 5 power. Skin: Thickened erythematous skin bilateral lower extremities Psychiatry: Judgement and insight appear normal. Mood & affect appropriate.     Data Reviewed: I have personally reviewed following labs and imaging studies  CBC: Recent Labs  Lab 03/17/20 2331 03/18/20 0457 03/19/20 0403  WBC 11.2* 10.4 10.4  NEUTROABS 6.7 6.4  --   HGB 12.0 12.0 11.0*  HCT 38.7 38.4 35.7*  MCV 99.0 99.0 99.4  PLT 244 229 213   Basic Metabolic Panel: Recent Labs  Lab 03/17/20 2331 03/18/20 0457 03/19/20 0403  NA 140 141 137  K 5.0 5.0 4.8  CL 100 100 97*  CO2 31 33* 31  GLUCOSE 90 102*  90  BUN 30* 28* 31*  CREATININE 1.40* 1.41* 1.63*  CALCIUM 9.2 9.2 8.4*  MG  --  2.3 2.0   GFR: Estimated Creatinine Clearance: 33.6 mL/min (A) (by C-G formula based on SCr of 1.63 mg/dL (H)). Liver Function Tests: Recent Labs  Lab 03/17/20 2331 03/18/20 0457  AST 36 37  ALT 23 24  ALKPHOS 304* 304*  BILITOT 0.8 0.9  PROT 6.9 6.9  ALBUMIN 3.1* 2.9*   No results for input(s): LIPASE, AMYLASE in the last 168 hours. No results for input(s): AMMONIA in the last 168 hours. Coagulation Profile: No results for input(s): INR, PROTIME in the last 168 hours. Cardiac Enzymes: No results for  input(s): CKTOTAL, CKMB, CKMBINDEX, TROPONINI in the last 168 hours. BNP (last 3 results) No results for input(s): PROBNP in the last 8760 hours. HbA1C: Recent Labs    03/18/20 0457  HGBA1C 5.8*   CBG: Recent Labs  Lab 03/18/20 1159 03/18/20 1538 03/18/20 1646 03/18/20 2141 03/19/20 0730  GLUCAP 98 84 83 125* 73   Lipid Profile: No results for input(s): CHOL, HDL, LDLCALC, TRIG, CHOLHDL, LDLDIRECT in the last 72 hours. Thyroid Function Tests: Recent Labs    03/18/20 0457  TSH 5.881*  FREET4 1.35*   Anemia Panel: No results for input(s): VITAMINB12, FOLATE, FERRITIN, TIBC, IRON, RETICCTPCT in the last 72 hours. Sepsis Labs: No results for input(s): PROCALCITON, LATICACIDVEN in the last 168 hours.  Recent Results (from the past 240 hour(s))  Urine culture     Status: Abnormal   Collection Time: 03/17/20 11:31 PM   Specimen: Urine, Clean Catch  Result Value Ref Range Status   Specimen Description   Final    URINE, CLEAN CATCH Performed at [facility], [address], [place], [place] [address]    Special Requests   Final    NONE Performed at [facility], [address] [place], [place] [address]    Culture MULTIPLE SPECIES PRESENT, SUGGEST RECOLLECTION (A)  Final   Report Status 03/18/2020 FINAL  Final  SARS CORONAVIRUS 2 (TAT 6-24 HRS) Nasopharyngeal Nasopharyngeal Swab     Status: None   Collection Time: 03/18/20  2:27 AM   Specimen: Nasopharyngeal Swab  Result Value Ref Range Status   SARS Coronavirus 2 NEGATIVE NEGATIVE Final    Comment: (NOTE) SARS-CoV-2 target nucleic acids are NOT DETECTED. The SARS-CoV-2 RNA is generally detectable in upper and lower respiratory specimens during the acute phase of infection. Negative results do not preclude SARS-CoV-2 infection, do not rule out co-infections with other pathogens, and should not be used as the sole basis for treatment or other patient management  decisions. Negative results must be combined with clinical observations, patient history, and epidemiological information. The expected result is Negative. Fact Sheet for Patients: HairSlick.no Fact Sheet for Healthcare Providers: quierodirigir.com This test is not yet approved or cleared by the [place] FDA and  has been authorized for detection and/or diagnosis of SARS-CoV-2 by FDA under an Emergency Use Authorization (EUA). This EUA will remain  in effect (meaning this test can be used) for the duration of the COVID-19 declaration under Section 56 4(b)(1) of the Act, 21 U.S.C. section 360bbb-3(b)(1), unless the authorization is terminated or revoked sooner. Performed at [facility], [address], [place], [place] [address]          Radiology Studies: CT CHEST WO CONTRAST  Result Date: 03/18/2020 CLINICAL DATA:  Pain from the left shoulder bleed to the  left hip for 2 weeks. EXAM: CT CHEST WITHOUT CONTRAST TECHNIQUE: Multidetector CT imaging of the chest was performed following the standard protocol without IV contrast. COMPARISON:  November 27, 2019 FINDINGS: Cardiovascular: There is moderate severity calcification of the aortic arch. Normal heart size. No pericardial effusion. Marked severity coronary artery calcification is seen. Mediastinum/Nodes: No enlarged mediastinal or axillary lymph nodes. Lungs/Pleura: A new 5 mm noncalcified lung nodule is seen within the anterolateral aspect of the right upper lobe (axial CT image 55, CT series number 5). 6 mm and 7 mm noncalcified lung nodule seen within the posterior aspect of the left apex. This represents a new finding when compared to the prior exam. A stable 6 mm noncalcified lung nodule is seen within the posterior aspect of the left lower lobe (axial CT image 70, CT series number 5). A 6 mm noncalcified lung nodule is seen within the anterolateral aspect of the left  lower lobe (axial CT image 82, CT series number 5). This represents a new finding when compared to the prior study. Stable 4 mm and 5 mm noncalcified lung nodules are seen within the right middle lobe. A small patchy area of atelectasis and/or early infiltrate is seen within the posteromedial aspect of the left lower lobe. There is no evidence of a pleural effusion or pneumothorax. Upper Abdomen: There is a moderate-sized hiatal hernia. Musculoskeletal: Multilevel degenerative changes seen throughout the thoracic spine. IMPRESSION: 1. Multiple new subcentimeter bilateral noncalcified lung nodules. Correlation with 3 month follow-up chest CT is recommended to determine stability. 2. Small patchy area of atelectasis and/or early infiltrate within the posteromedial aspect of the left lower lobe. 3. Moderate-sized hiatal hernia. Aortic Atherosclerosis (ICD10-I70.0). Electronically Signed   By: Aram Candela M.D.   On: 03/18/2020 04:06   CT THORACIC SPINE WO CONTRAST  Result Date: 03/18/2020 CLINICAL DATA:  Back pain EXAM: CT THORACIC AND LUMBAR SPINE WITHOUT CONTRAST TECHNIQUE: Multidetector CT imaging of the thoracic and lumbar spine was performed without contrast. Multiplanar CT image reconstructions were also generated. COMPARISON:  CTA chest 11/27/2019, CT abdomen pelvis 08/08/2019 FINDINGS: CT THORACIC SPINE FINDINGS Alignment: Mild right convex asymmetry Vertebrae: Bones are diffusely osteopenic. At the T11 and T12 levels, there is lucency of the anterior vertebral bodies. Paraspinal and other soft tissues: Please see report for dedicated CT of the chest. Disc levels: There is no bony spinal canal stenosis. CT LUMBAR SPINE FINDINGS Segmentation: 5 lumbar type vertebrae. Alignment: Grade 1 retrolisthesis at L3-4 and grade 1 anterolisthesis at L4-5. Mild left convex asymmetry. Vertebrae: Osteopenia. Bulky osteophytes at L1-2, L2-3 and L3-4. There are large areas of lucency within the posterior aspect of the  L4 and L5 vertebrae. Paraspinal and other soft tissues: Calcific aortic atherosclerosis. Disc levels: L1-2: Partially calcified disc material and endplate spurring with mild spinal canal stenosis. L2-3: Endplate spurring with mild spinal canal stenosis. L3-4: Severe facet hypertrophy. Disc bulge with endplate spurring. Severe spinal canal stenosis. L4-5: Severe facet hypertrophy with disc bulge and endplate spurring. Moderate spinal canal stenosis. L5-S1: No bony spinal canal stenosis. IMPRESSION: 1. Generalized osteopenia with multiple areas of somewhat focal demineralization. This appearance is exaggerated by photon starvation artifacts due to patient body habitus. Within these limitations, no acute fracture. 2. Severe multilevel degenerative disc disease with bulky osteophytosis causing fusion of long segments of the lower thoracic and lumbar spine. 3. Multilevel moderate-to-severe lumbar spinal canal stenosis. 4.  Aortic atherosclerosis (ICD10-I70.0). Electronically Signed   By: Deatra Robinson M.D.   On:  03/18/2020 04:04   CT LUMBAR SPINE WO CONTRAST  Result Date: 03/18/2020 CLINICAL DATA:  Back pain EXAM: CT THORACIC AND LUMBAR SPINE WITHOUT CONTRAST TECHNIQUE: Multidetector CT imaging of the thoracic and lumbar spine was performed without contrast. Multiplanar CT image reconstructions were also generated. COMPARISON:  CTA chest 11/27/2019, CT abdomen pelvis 08/08/2019 FINDINGS: CT THORACIC SPINE FINDINGS Alignment: Mild right convex asymmetry Vertebrae: Bones are diffusely osteopenic. At the T11 and T12 levels, there is lucency of the anterior vertebral bodies. Paraspinal and other soft tissues: Please see report for dedicated CT of the chest. Disc levels: There is no bony spinal canal stenosis. CT LUMBAR SPINE FINDINGS Segmentation: 5 lumbar type vertebrae. Alignment: Grade 1 retrolisthesis at L3-4 and grade 1 anterolisthesis at L4-5. Mild left convex asymmetry. Vertebrae: Osteopenia. Bulky osteophytes at  L1-2, L2-3 and L3-4. There are large areas of lucency within the posterior aspect of the L4 and L5 vertebrae. Paraspinal and other soft tissues: Calcific aortic atherosclerosis. Disc levels: L1-2: Partially calcified disc material and endplate spurring with mild spinal canal stenosis. L2-3: Endplate spurring with mild spinal canal stenosis. L3-4: Severe facet hypertrophy. Disc bulge with endplate spurring. Severe spinal canal stenosis. L4-5: Severe facet hypertrophy with disc bulge and endplate spurring. Moderate spinal canal stenosis. L5-S1: No bony spinal canal stenosis. IMPRESSION: 1. Generalized osteopenia with multiple areas of somewhat focal demineralization. This appearance is exaggerated by photon starvation artifacts due to patient body habitus. Within these limitations, no acute fracture. 2. Severe multilevel degenerative disc disease with bulky osteophytosis causing fusion of long segments of the lower thoracic and lumbar spine. 3. Multilevel moderate-to-severe lumbar spinal canal stenosis. 4.  Aortic atherosclerosis (ICD10-I70.0). Electronically Signed   By: Ulyses Jarred M.D.   On: 03/18/2020 04:04   DG Chest Portable 1 View  Result Date: 03/17/2020 CLINICAL DATA:  Peripheral edema EXAM: PORTABLE CHEST 1 VIEW COMPARISON:  January 15, 2020 FINDINGS: The heart size and mediastinal contours are within normal limits. Mildly increased interstitial markings are seen throughout both lungs. There is also slightly patchy airspace opacity seen within the right upper lobe. Aortic knob calcifications are seen. No pleural effusion. No acute osseous abnormality. IMPRESSION: Interstitial/patchy airspace opacity within both lungs which could be due to edema and/or infectious etiology. Electronically Signed   By: Prudencio Pair M.D.   On: 03/17/2020 23:20   ECHOCARDIOGRAM COMPLETE  Result Date: 03/18/2020    ECHOCARDIOGRAM REPORT   Patient Name:   Brandy Morris Steve Date of Exam: 03/18/2020 Medical Rec #:  [id number]         Height:       62.0 in Accession #:    [id number]       Weight:       274.7 lb Date of Birth:  [birth year]       BSA:          2.188 m Patient Age:    89 years         BP:           138/48 mmHg Patient Gender: F                HR:           69 bpm. Exam Location:  Inpatient Procedure: 2D Echo, Cardiac Doppler and Color Doppler Indications:    CHF  History:        Patient has no prior history of Echocardiogram examinations. LE  edema; Risk Factors:Hypertension, Morbid obesity and Diabetes.                 Recent COVID,.  Sonographer:    Lavenia AtlasBrooke Strickland Referring Phys: [id number] Marinda ElkGEORGE J SHALHOUB  Sonographer Comments: Patient is morbidly obese. IMPRESSIONS  1. Normal LV systolic function; grade 1 diastolic dysfunction.  2. Left ventricular ejection fraction, by estimation, is 60 to 65%. The left ventricle has normal function. The left ventricle has no regional wall motion abnormalities. There is moderate left ventricular hypertrophy. Left ventricular diastolic parameters are consistent with Grade I diastolic dysfunction (impaired relaxation). Elevated left atrial pressure.  3. Right ventricular systolic function is normal. The right ventricular size is normal.  4. The mitral valve is normal in structure. No evidence of mitral valve regurgitation. No evidence of mitral stenosis.  5. The aortic valve is normal in structure. Aortic valve regurgitation is not visualized. No aortic stenosis is present.  6. The inferior vena cava is normal in size with greater than 50% respiratory variability, suggesting right atrial pressure of 3 mmHg. FINDINGS  Left Ventricle: Left ventricular ejection fraction, by estimation, is 60 to 65%. The left ventricle has normal function. The left ventricle has no regional wall motion abnormalities. The left ventricular internal cavity size was normal in size. There is  moderate left ventricular hypertrophy. Left ventricular diastolic parameters are consistent with Grade I  diastolic dysfunction (impaired relaxation). Elevated left atrial pressure. Right Ventricle: The right ventricular size is normal.Right ventricular systolic function is normal. Left Atrium: Left atrial size was normal in size. Right Atrium: Right atrial size was normal in size. Pericardium: There is no evidence of pericardial effusion. Mitral Valve: The mitral valve is normal in structure. Normal mobility of the mitral valve leaflets. Mild mitral annular calcification. No evidence of mitral valve regurgitation. No evidence of mitral valve stenosis. Tricuspid Valve: The tricuspid valve is normal in structure. Tricuspid valve regurgitation is trivial. No evidence of tricuspid stenosis. Aortic Valve: The aortic valve is normal in structure. Aortic valve regurgitation is not visualized. No aortic stenosis is present. Aortic valve mean gradient measures 9.0 mmHg. Aortic valve peak gradient measures 12.8 mmHg. Aortic valve area, by VTI measures 1.53 cm. Pulmonic Valve: The pulmonic valve was not well visualized. Pulmonic valve regurgitation is not visualized. No evidence of pulmonic stenosis. Aorta: The aortic root is normal in size and structure. Venous: The inferior vena cava is normal in size with greater than 50% respiratory variability, suggesting right atrial pressure of 3 mmHg.  Additional Comments: Normal LV systolic function; grade 1 diastolic dysfunction.  LEFT VENTRICLE PLAX 2D LVIDd:         3.60 cm  Diastology LVIDs:         2.50 cm  LV e' lateral:   6.22 cm/s LV PW:         1.40 cm  LV E/e' lateral: 21.4 LV IVS:        1.50 cm  LV e' medial:    7.84 cm/s LVOT diam:     1.90 cm  LV E/e' medial:  17.0 LV SV:         73 LV SV Index:   33 LVOT Area:     2.84 cm  RIGHT VENTRICLE RV Basal diam:  3.10 cm RV S prime:     8.70 cm/s TAPSE (M-mode): 2.4 cm LEFT ATRIUM           Index       RIGHT ATRIUM  Index LA diam:      3.20 cm 1.46 cm/m  RA Area:     19.40 cm LA Vol (A2C): 56.5 ml 25.83 ml/m RA  Volume:   61.40 ml  28.07 ml/m LA Vol (A4C): 61.0 ml 27.88 ml/m  AORTIC VALVE AV Area (Vmax):    1.77 cm AV Area (Vmean):   1.43 cm AV Area (VTI):     1.53 cm AV Vmax:           179.00 cm/s AV Vmean:          141.000 cm/s AV VTI:            0.476 m AV Peak Grad:      12.8 mmHg AV Mean Grad:      9.0 mmHg LVOT Vmax:         112.00 cm/s LVOT Vmean:        71.200 cm/s LVOT VTI:          0.257 m LVOT/AV VTI ratio: 0.54  AORTA Ao Root diam: 3.10 cm MITRAL VALVE MV Area (PHT): 3.17 cm     SHUNTS MV Decel Time: 239 msec     Systemic VTI:  0.26 m MV E velocity: 133.00 cm/s  Systemic Diam: 1.90 cm MV A velocity: 139.00 cm/s MV E/A ratio:  0.96 Olga Millers MD Electronically signed by Olga Millers MD Signature Date/Time: 03/18/2020/3:50:18 PM    Final    US Abdomen Limited RUQ  Result Date: 03/18/2020 CLINICAL DATA:  Abnormal liver function tests EXAM: ULTRASOUND ABDOMEN LIMITED RIGHT UPPER QUADRANT COMPARISON:  None. FINDINGS: Gallbladder: Gallbladder is surgically absent Common bile duct: Diameter: 6 mm Liver: Coarsened hepatic echotexture with mildly nodular contour. The liver parenchyma is echogenic. Portal vein is patent on color Doppler imaging with normal direction of blood flow towards the liver. Other: None. IMPRESSION: Hepatic steatosis and possible cirrhosis. Surgically absent gallbladder without biliary dilatation. Electronically Signed   By: Deatra Robinson M.D.   On: 03/18/2020 03:01        Scheduled Meds: . acetaminophen  650 mg Oral BID  . amLODipine  5 mg Oral QPM  . carvedilol  12.5 mg Oral QHS  . enoxaparin (LOVENOX) injection  40 mg Subcutaneous Q24H  . insulin aspart  0-15 Units Subcutaneous TID AC & HS  . levothyroxine  150 mcg Oral QPM  . lisinopril  40 mg Oral QPM  . nystatin   Topical TID  . pantoprazole  40 mg Oral Daily   Continuous Infusions:   LOS: 0 days    Time spent: 32 minutes spent on chart review, discussion with nursing staff, consultants, updating family  and interview/physical exam; more than 50% of that time was spent in counseling and/or coordination of care.    Alvira Philips [place], DO [facility] Available via Epic secure chat 7am-7pm After these hours, please refer to coverage provider listed on [email or web address] 03/19/2020, 11:04 AM

## 2020-03-19 NOTE — Evaluation (Signed)
Occupational Therapy Evaluation Patient Details Name: Brandy Morris MRN: [id number] DOB: December 08, 1934 Today's Date: 03/19/2020    History of Present Illness 85 year old female with past medical history of diabetes mellitus type 2, hypertension, morbid obesity, lymphedema of the lower extremities, gastroesophageal reflux disease, hypothyroidism, chronic kidney disease stage IIIa and recent COVID-19 infection in January 2021 who presents to [facility] emergency department with complaints of low back pain   Clinical Impression   PTA Pt getting assist for bathing/dressing from family and ambulating short distances with 3 wheeled RW. Today Pt is max A +2 for bed mobility, max A for LB ADL, able to perform transfers with mod A +2 and improved with repetition. At this time recommending SNF post-acute to maximize safety and independence in ADL and functional transfers and to return to [facility] prior to hospital admission. OT will continue to follow acutely, next session to focus on transfers and education on BLT back precautions for pain management.    Follow Up Recommendations  SNF    Equipment Recommendations  (Pt has appropriate DME)    Recommendations for Other Services       Precautions / Restrictions Precautions Precautions: Fall Precaution Comments: back pain, urinary urgency Restrictions Weight Bearing Restrictions: No      Mobility Bed Mobility Overal bed mobility: Needs Assistance Bed Mobility: Rolling;Sidelying to Sit Rolling: Max assist;+2 for physical assistance;+2 for safety/equipment Sidelying to sit: Max assist;+2 for physical assistance;+2 for safety/equipment;HOB elevated       General bed mobility comments: educated on sequence for bed mobility with back precautions for pain management, HEAVY of of bed pad  Transfers Overall transfer level: Needs assistance Equipment used: 2 person hand held assist Transfers: Sit to/from [employer] Sit  to Stand: Mod assist;+2 physical assistance;+2 safety/equipment;From elevated surface Stand pivot transfers: Mod assist;+2 physical assistance;+2 safety/equipment;From elevated surface       General transfer comment: Pt was mod A +2 initially from bed, from recliner mod A +1 (+2 for safety) for additional sit<>stand    Balance Overall balance assessment: Needs assistance Sitting-balance support: Feet supported;Bilateral upper extremity supported Sitting balance-Leahy Scale: Fair       Standing balance-Leahy Scale: Poor Standing balance comment: reliant on external support from therapy team                           ADL either performed or assessed with clinical judgement   ADL Overall ADL's : Needs assistance/impaired Eating/Feeding: Set up   Grooming: Oral care;Set up;Sitting Grooming Details (indicate cue type and reason): able to bring BUE to face for grooming tasks Upper Body Bathing: Moderate assistance Upper Body Bathing Details (indicate cue type and reason): baseline Lower Body Bathing: Maximal assistance Lower Body Bathing Details (indicate cue type and reason): baseline Upper Body Dressing : Minimal assistance;Sitting Upper Body Dressing Details (indicate cue type and reason): to don hospital gown Lower Body Dressing: Maximal assistance;+2 for safety/equipment;Sit to/from stand   Toilet Transfer: Moderate assistance;+2 for physical assistance;+2 for safety/equipment;Stand-pivot;BSC Toilet Transfer Details (indicate cue type and reason): 2 person HHA Toileting- Clothing Manipulation and Hygiene: Total assistance       Functional mobility during ADLs: Moderate assistance;+2 for physical assistance;+2 for safety/equipment General ADL Comments: very pleasant, decreased strength and activity tolerance     Vision Patient Visual Report: No change from baseline       Perception     Praxis      Pertinent Vitals/Pain Pain  Assessment: Faces Faces Pain  Scale: Hurts little more Pain Location: lower back, constant Pain Descriptors / Indicators: Discomfort;Grimacing Pain Intervention(s): Limited activity within patient's tolerance;Monitored during session;Repositioned;Premedicated before session     Hand Dominance     Extremity/Trunk Assessment Upper Extremity Assessment Upper Extremity Assessment: Generalized weakness   Lower Extremity Assessment Lower Extremity Assessment: Defer to PT evaluation   Cervical / Trunk Assessment Cervical / Trunk Assessment: Other exceptions(extra body habitus) Cervical / Trunk Exceptions: history of low back pain   Communication Communication Communication: No difficulties   Cognition Arousal/Alertness: Awake/alert Behavior During Therapy: WFL for tasks assessed/performed Overall Cognitive Status: Within Functional Limits for tasks assessed                                     General Comments  Needs to be able to walk about 10 feet to get to bathroom/commode    Exercises     Shoulder Instructions      Home Living Family/patient expects to be discharged to:: Private residence Living Arrangements: Spouse/significant other Available Help at Discharge: Available PRN/intermittently Type of Home: House Home Access: Ramped entrance     Home Layout: One level     Bathroom Shower/Tub: Other (comment)(Pt is sponge bathed by famiy at baseline)         Home Equipment: Bedside commode(3 wheeled RW)   Additional Comments: sleeps in lift chair. spuse has PD and is physically unable to assist patient, family not available      Prior Functioning/Environment Level of Independence: Needs assistance  Gait / Transfers Assistance Needed: basic ambulates to BR  with 3 wheeled( about 40'). ADL's / Homemaking Assistance Needed: sponge bathes and dresses with assist from family            OT Problem List: Decreased activity tolerance;Decreased strength;Impaired balance (sitting  and/or standing);Obesity;Pain      OT Treatment/Interventions: Self-care/ADL training;Therapeutic exercise;DME and/or AE instruction;Therapeutic activities;Patient/family education;Balance training    OT Goals(Current goals can be found in the care plan section) Acute Rehab OT Goals Patient Stated Goal: I want to go home, My husband will miss me OT Goal Formulation: With patient Time For Goal Achievement: 04/02/20 Potential to Achieve Goals: Good ADL Goals Pt Will Perform Upper Body Bathing: with min guard assist;with caregiver independent in assisting;sitting Pt Will Perform Upper Body Dressing: with max assist;with caregiver independent in assisting;with adaptive equipment;sitting Pt Will Transfer to Toilet: with supervision;ambulating;bedside commode Pt Will Perform Toileting - Clothing Manipulation and hygiene: with max assist;with caregiver independent in assisting;sit to/from stand Additional ADL Goal #1: Pt will recall 3 ways of conserving energy during ADL routine with one or less cues  OT Frequency: Min 2X/week   Barriers to D/C:            Co-evaluation              AM-PAC OT "6 Clicks" Daily Activity     Outcome Measure Help from another person eating meals?: None Help from another person taking care of personal grooming?: A Little Help from another person toileting, which includes using toliet, bedpan, or urinal?: A Lot Help from another person bathing (including washing, rinsing, drying)?: A Lot Help from another person to put on and taking off regular upper body clothing?: A Lot Help from another person to put on and taking off regular lower body clothing?: Total 6 Click Score: 14   End of Session Equipment  Utilized During Treatment: Gait belt;Oxygen(2L (SpO2 91 and above)) Nurse Communication: Mobility status  Activity Tolerance: Patient tolerated treatment well Patient left: in chair;with call bell/phone within reach;with chair alarm set;Other  (comment)(chair built up for easier sit<>stand with RN staff)  OT Visit Diagnosis: Unsteadiness on feet (R26.81);Other abnormalities of gait and mobility (R26.89);History of falling (Z91.81);Muscle weakness (generalized) (M62.81)                Time: 7893-8101 OT Time Calculation (min): 30 min Charges:  OT General Charges $OT Visit: 1 Visit OT Evaluation $OT Eval Moderate Complexity: 1 Mod OT Treatments $Self Care/Home Management : 8-22 mins  Jesse Sans OTR/L Acute Rehabilitation Services Pager: [phone] Office: [place] 03/19/2020, 1:03 PM

## 2020-03-19 NOTE — Care Management Obs Status (Signed)
MEDICARE OBSERVATION STATUS NOTIFICATION   Patient Details  Name: Brandy Morris MRN: [id number] Date of Birth: 02-Dec-1934   Medicare Observation Status Notification Given:  Yes    Clearance Coots, LCSW 03/19/2020, 11:39 AM

## 2020-03-19 NOTE — TOC Progression Note (Signed)
Transition of Care [facility]) - Progression Note    Patient Details  Name: Brandy Morris MRN: [id number] Date of Birth: [birth year]  Transition of [facility]) CM/SW Contact  Clearance Coots, LCSW Phone Number: 03/19/2020, 3:19 PM  Clinical Narrative:    Patient and family agreeable to [facility].  [employer] Medicare/[employer] Auth. Pending  Physician notified to put in a covid test.     Expected Discharge Plan: Skilled Nursing Facility Barriers to Discharge: Continued Medical Work up  Expected Discharge Plan and Services Expected Discharge Plan: Skilled Nursing Facility In-house Referral: NA Discharge Planning Services: NA Post Acute Care Choice: Skilled Nursing Facility Living arrangements for the past 2 months: Single Family Home                 DME Arranged: N/A DME Agency: NA       HH Arranged: NA HH Agency: NA         Social Determinants of Health (SDOH) Interventions    Readmission Risk Interventions No flowsheet data found.

## 2020-03-19 NOTE — Consult Note (Signed)
WOC Nurse Consult Note: Reason for Consult: REconsult for sacral wounds.  Patient states they have been there and she has been putting hydrocortisone cream to the area.  She sleeps in a recliner and pressure and moisture are contributing factors.  Wound type: pressure and moisture stage 2 pressure injury Pressure Injury POA: Yes Measurement: right gluteal:  1 cm x 0.5 cm x 0.2 cm  Wound UUV:OZDG and moist Drainage (amount, consistency, odor) minimal serosanguinous  No odor Periwound:nonblanchable erythema.  Patient is up to the chair but stands with walker and assist.  Dressing procedure/placement/frequency: Cleanse sacral wound with NS and pat dry.  Apply aquacel to wound bed.  Cover with silicone foam dressing. Change every three days.  Encourage to turn and reposition every two hours.  Will not follow at this time.  Please re-consult if needed.  Maple Hudson MSN, RN, FNP-BC CWON Wound, Ostomy, Continence Nurse Pager [phone]

## 2020-03-20 DIAGNOSIS — M549 Dorsalgia, unspecified: Secondary | ICD-10-CM | POA: Diagnosis not present

## 2020-03-20 DIAGNOSIS — E1121 Type 2 diabetes mellitus with diabetic nephropathy: Secondary | ICD-10-CM | POA: Diagnosis not present

## 2020-03-20 DIAGNOSIS — I890 Lymphedema, not elsewhere classified: Secondary | ICD-10-CM | POA: Diagnosis not present

## 2020-03-20 DIAGNOSIS — E039 Hypothyroidism, unspecified: Secondary | ICD-10-CM | POA: Diagnosis not present

## 2020-03-20 LAB — GLUCOSE, CAPILLARY
Glucose-Capillary: 66 mg/dL — ABNORMAL LOW (ref 70–99)
Glucose-Capillary: 80 mg/dL (ref 70–99)
Glucose-Capillary: 118 mg/dL — ABNORMAL HIGH (ref 70–99)

## 2020-03-20 LAB — BASIC METABOLIC PANEL
Anion gap: 8 (ref 5–15)
BUN: 39 mg/dL — ABNORMAL HIGH (ref 8–23)
CO2: 30 mmol/L (ref 22–32)
Calcium: 8 mg/dL — ABNORMAL LOW (ref 8.9–10.3)
Chloride: 92 mmol/L — ABNORMAL LOW (ref 98–111)
Creatinine, Ser: 2.07 mg/dL — ABNORMAL HIGH (ref 0.44–1.00)
GFR calc Af Amer: 25 mL/min — ABNORMAL LOW (ref >60)
GFR calc non Af Amer: 22 mL/min — ABNORMAL LOW (ref >60)
Glucose, Bld: 91 mg/dL (ref 70–99)
Potassium: 5.8 mmol/L — ABNORMAL HIGH (ref 3.5–5.1)
Sodium: 130 mmol/L — ABNORMAL LOW (ref 135–145)

## 2020-03-20 MED ORDER — SODIUM CHLORIDE 0.9 % IV BOLUS: 500.0000 mL | Freq: Once | INTRAVENOUS | Status: DC

## 2020-03-20 MED ORDER — TRAMADOL HCL 50 MG PO TABS: 50.0000 mg | ORAL_TABLET | Freq: Four times a day (QID) | ORAL | 0 refills | Status: DC | PRN

## 2020-03-20 NOTE — Progress Notes (Signed)
RN made aware of 66 CBG. Juice given and instructed to repeat CBG in 30 minutes

## 2020-03-20 NOTE — Progress Notes (Signed)
Discharge instructions and report were given to facility. All questions were answered. Patient was transported to facility by [facility]. ? ?

## 2020-03-20 NOTE — Discharge Summary (Addendum)
Physician Discharge Summary  JULIETTE STANDRE JFH:[id number] DOB: Mar 21, 1935 DOA: 03/17/2020  PCP: [name], MD  Admit date: 03/17/2020 Discharge date: 03/20/2020  Admitted From: Home Disposition: [facility]   Recommendations for Outpatient Follow-up:  1. Follow up with PCP in 1-2 weeks 2. Discontinue lisinopril secondary to rise in creatinine. 3. Please obtain BMP in one week to assess renal function 4. Consider outpatient neurosurgery evaluation for lumbar spinal stenosis 5. Recommend repeat CT chest in 3 months for pulmonary nodule surveillance  Home Health: No Equipment/Devices: None  Discharge Condition: Stable CODE STATUS: Full code Diet recommendation: Cardiac, consistent carbohydrate  History of present illness:  Brandy Morris 85 year old Caucasian female with past medical history remarkable for type 2 diabetes mellitus, essential hypertension, bilateral lower extremity lymphedema, morbid obesity, GERD, hypothyroidism, CKD stage IIIa, recent Covid-19 infection January 2021 who presented to the ED with complaints of low back pain.  Onset reportedly 2 weeks ago, sharp in quality with nonradiation.  Pain is worse with movement and improved with rest.  No loss of bowel or bladder control and no paresthesias.  No lower extremity weakness; but typically ambulates using a walker at baseline.  Patient further denies any dysuria, no abdominal pain, no diarrhea, no fevers, no nausea, no vomiting, no vaginal discharge or sick contacts.  Her symptoms continue to worsen to the point where she was having difficulty ambulating and inability to leave her house due to the severity of her pain.  Hospital course:  Intractable low back pain Degenerative disc disease Lumbar spinal canal stenosis Presenting with intractable low back pain progressing over the past 2 weeks.  No weakness or paresthesias.  Now with progressive difficulty ambulating.  CT T/L-spine notable for generalized  osteopenia, severe multilevel degenerative disc disease with bulky osteophytes, multilevel moderate/severe lumbar spinal canal stenosis.  Continue pain control tramadol 50 mg every 6 hours as needed for moderate/severe pain.  Seen by physical therapy with recommendations of SNF placement, will discharge to [facility] for further rehabilitation.  Consider outpatient follow-up with neurosurgery further evaluation of degenerative disc disease with moderate/severe lumbar spinal canal stenosis  Bilateral pulmonary infiltrates on chest x-ray Bilateral lower extremity lymphedema Multiple pulmonary nodules Chest x-ray notable for bilateral patchy infiltrates.  Patient with history of bilateral lower extremity lymphedema.  Recent Covid-19 infection January 2021, repeat Covid-19 PCR admission negative.  CT chest with multiple bilateral lung nodules and small patchy atelectasis versus infiltrate left lower lobe.  Patient is afebrile.  Unna boot was reapplied by the [employer] on 03/18/2020.  Recommend repeat CT chest 3 months follow-up for lung nodule surveillance.  Hypothyroidism TSH 5.881.  Free T4 1.35. Continue levothyroxine 150  Mcg p.o. daily  CKD stage IIIa Creatinine baseline 1.1-1.2.  Received aggressive IV diuresis initially for lymphedema which was subsequently discontinued.  Creatinine trended up to 2.0 at time of discharge.  This continued ACE inhibitor.  Recommend repeat BMP in 1 week for further surveillance.  Type 2 diabetes mellitus On glimepiride 1 mg p.o. daily at home.  5.8 on 03/18/2020, well controlled.  Essential hypertension Continue coreg12.5 mg p.o. nightly, amlodipine 5 mg p.o. daily.  Discontinued home lisinopril due to rising creatinine.  Morbid obesity Discussed with patient needs for aggressive lifestyle changes, weight loss as this complicates all facets of care.  GERD: Continue Protonix 40mg  p.o. daily  History of Covid-19 viral pneumonia Patient with history of  Covid-19 viral pneumonia in January 2021.  Utilizes 2 L nasal cannula at  baseline nocturnally.  Chest x-ray with diffuse patchy infiltrates, CT chest with multiple lung nodules and atelectasis versus infiltrate left lower lobe.  Repeat Covid-19 PCR on 03/19/2020 negative.  Pressure injury, right gluteal region; present on admission Pressure Injury 11/28/19 Buttocks Right Stage 2 -  Partial thickness loss of dermis presenting as a shallow open injury with a red, pink wound bed without slough. Two dime size wounds in the folds of her right butt cheek. (Active)  11/28/19 0915  Location: Buttocks  Location Orientation: Right  Staging: Stage 2 -  Partial thickness loss of dermis presenting as a shallow open injury with a red, pink wound bed without slough.  Wound Description (Comments): Two dime size wounds in the folds of her right butt cheek.  Present on Admission:    --Continue wound care and offloading   Discharge Diagnoses:  Active Problems:   GERD (gastroesophageal reflux disease)   Hypothyroidism   Type 2 diabetes mellitus with stage 3 chronic kidney disease, without long-term current use of insulin (HCC)   Essential hypertension   Chronic kidney disease, stage 3a   Intractable back pain   Lymphedema of both lower extremities    Discharge Instructions  Discharge Instructions    Call MD for:  difficulty breathing, headache or visual disturbances   Complete by: As directed    Call MD for:  extreme fatigue   Complete by: As directed    Call MD for:  persistant dizziness or light-headedness   Complete by: As directed    Call MD for:  persistant nausea and vomiting   Complete by: As directed    Call MD for:  severe uncontrolled pain   Complete by: As directed    Call MD for:  temperature >100.4   Complete by: As directed    Diet - low sodium heart healthy   Complete by: As directed    Increase activity slowly   Complete by: As directed      Allergies as of 03/20/2020       Reactions   Ciprofloxacin Hives   Codeine    Don't like the way it makes me feel.   Latex Itching, Rash, Other (See Comments)   Povidone-iodine Rash   Other reaction(s): Other (See Comments) unknown      Medication List    STOP taking these medications   lisinopril 20 MG tablet Commonly known as: ZESTRIL   meloxicam 15 MG tablet Commonly known as: MOBIC     TAKE these medications   albuterol 108 (90 Base) MCG/ACT inhaler Commonly known as: VENTOLIN HFA Inhale 2 puffs into the lungs every 6 (six) hours as needed for wheezing or shortness of breath.   amLODipine 5 MG tablet Commonly known as: NORVASC Take 5 mg by mouth every evening.   BLOOD GLUCOSE TEST STRIPS Strp 1 strip by Other route 3 (three) times a week.   carvedilol 6.25 MG tablet Commonly known as: COREG Take 12.5 mg by mouth at bedtime.   furosemide 40 MG tablet Commonly known as: LASIX Take 40 mg by mouth daily.   glimepiride 1 MG tablet Commonly known as: AMARYL Take 1 mg by mouth daily.   levothyroxine 150 MCG tablet Commonly known as: SYNTHROID Take 150 mcg by mouth every evening.   nystatin powder Commonly known as: MYCOSTATIN/NYSTOP Apply 1 application topically 3 (three) times daily as needed for rash. Apply to groin area tid prn for yeast in groin area.   omeprazole 20 MG capsule Commonly known as:  PRILOSEC Take 20 mg by mouth daily.   traMADol 50 MG tablet Commonly known as: ULTRAM Take 1 tablet (50 mg total) by mouth every 6 (six) hours as needed for moderate pain.   triamcinolone cream 0.1 % Commonly known as: KENALOG Apply 1 application topically daily as needed (rash).       Contact information for follow-up providers    Karleen Hampshire., MD. Schedule an appointment as soon as possible for a visit in 1 week(s).   Specialty: Internal Medicine Contact information: [address] [place] [address] [phone]            Contact information for  after-discharge care    Destination    [facility] Preferred SNF .   Service: Skilled Nursing Contact information: [place] [phone]                 Allergies  Allergen Reactions  . Ciprofloxacin Hives  . Codeine     Don't like the way it makes me feel.  . Latex Itching, Rash and Other (See Comments)  . Povidone-Iodine Rash    Other reaction(s): Other (See Comments) unknown     Consultations:  None   Procedures/Studies: CT CHEST WO CONTRAST  Result Date: 03/18/2020 CLINICAL DATA:  Pain from the left shoulder bleed to the left hip for 2 weeks. EXAM: CT CHEST WITHOUT CONTRAST TECHNIQUE: Multidetector CT imaging of the chest was performed following the standard protocol without IV contrast. COMPARISON:  November 27, 2019 FINDINGS: Cardiovascular: There is moderate severity calcification of the aortic arch. Normal heart size. No pericardial effusion. Marked severity coronary artery calcification is seen. Mediastinum/Nodes: No enlarged mediastinal or axillary lymph nodes. Lungs/Pleura: A new 5 mm noncalcified lung nodule is seen within the anterolateral aspect of the right upper lobe (axial CT image 55, CT series number 5). 6 mm and 7 mm noncalcified lung nodule seen within the posterior aspect of the left apex. This represents a new finding when compared to the prior exam. A stable 6 mm noncalcified lung nodule is seen within the posterior aspect of the left lower lobe (axial CT image 70, CT series number 5). A 6 mm noncalcified lung nodule is seen within the anterolateral aspect of the left lower lobe (axial CT image 82, CT series number 5). This represents a new finding when compared to the prior study. Stable 4 mm and 5 mm noncalcified lung nodules are seen within the right middle lobe. A small patchy area of atelectasis and/or early infiltrate is seen within the posteromedial aspect of the left lower lobe. There is no  evidence of a pleural effusion or pneumothorax. Upper Abdomen: There is a moderate-sized hiatal hernia. Musculoskeletal: Multilevel degenerative changes seen throughout the thoracic spine. IMPRESSION: 1. Multiple new subcentimeter bilateral noncalcified lung nodules. Correlation with 3 month follow-up chest CT is recommended to determine stability. 2. Small patchy area of atelectasis and/or early infiltrate within the posteromedial aspect of the left lower lobe. 3. Moderate-sized hiatal hernia. Aortic Atherosclerosis (ICD10-I70.0). Electronically Signed   By: Virgina Norfolk M.D.   On: 03/18/2020 04:06   CT THORACIC SPINE WO CONTRAST  Result Date: 03/18/2020 CLINICAL DATA:  Back pain EXAM: CT THORACIC AND LUMBAR SPINE WITHOUT CONTRAST TECHNIQUE: Multidetector CT imaging of the thoracic and lumbar spine was performed without contrast. Multiplanar CT image reconstructions were also generated. COMPARISON:  CTA chest 11/27/2019, CT abdomen pelvis 08/08/2019 FINDINGS: CT THORACIC SPINE FINDINGS Alignment:  Mild right convex asymmetry Vertebrae: Bones are diffusely osteopenic. At the T11 and T12 levels, there is lucency of the anterior vertebral bodies. Paraspinal and other soft tissues: Please see report for dedicated CT of the chest. Disc levels: There is no bony spinal canal stenosis. CT LUMBAR SPINE FINDINGS Segmentation: 5 lumbar type vertebrae. Alignment: Grade 1 retrolisthesis at L3-4 and grade 1 anterolisthesis at L4-5. Mild left convex asymmetry. Vertebrae: Osteopenia. Bulky osteophytes at L1-2, L2-3 and L3-4. There are large areas of lucency within the posterior aspect of the L4 and L5 vertebrae. Paraspinal and other soft tissues: Calcific aortic atherosclerosis. Disc levels: L1-2: Partially calcified disc material and endplate spurring with mild spinal canal stenosis. L2-3: Endplate spurring with mild spinal canal stenosis. L3-4: Severe facet hypertrophy. Disc bulge with endplate spurring. Severe spinal  canal stenosis. L4-5: Severe facet hypertrophy with disc bulge and endplate spurring. Moderate spinal canal stenosis. L5-S1: No bony spinal canal stenosis. IMPRESSION: 1. Generalized osteopenia with multiple areas of somewhat focal demineralization. This appearance is exaggerated by photon starvation artifacts due to patient body habitus. Within these limitations, no acute fracture. 2. Severe multilevel degenerative disc disease with bulky osteophytosis causing fusion of long segments of the lower thoracic and lumbar spine. 3. Multilevel moderate-to-severe lumbar spinal canal stenosis. 4.  Aortic atherosclerosis (ICD10-I70.0). Electronically Signed   By: Deatra Robinson M.D.   On: 03/18/2020 04:04   CT LUMBAR SPINE WO CONTRAST  Result Date: 03/18/2020 CLINICAL DATA:  Back pain EXAM: CT THORACIC AND LUMBAR SPINE WITHOUT CONTRAST TECHNIQUE: Multidetector CT imaging of the thoracic and lumbar spine was performed without contrast. Multiplanar CT image reconstructions were also generated. COMPARISON:  CTA chest 11/27/2019, CT abdomen pelvis 08/08/2019 FINDINGS: CT THORACIC SPINE FINDINGS Alignment: Mild right convex asymmetry Vertebrae: Bones are diffusely osteopenic. At the T11 and T12 levels, there is lucency of the anterior vertebral bodies. Paraspinal and other soft tissues: Please see report for dedicated CT of the chest. Disc levels: There is no bony spinal canal stenosis. CT LUMBAR SPINE FINDINGS Segmentation: 5 lumbar type vertebrae. Alignment: Grade 1 retrolisthesis at L3-4 and grade 1 anterolisthesis at L4-5. Mild left convex asymmetry. Vertebrae: Osteopenia. Bulky osteophytes at L1-2, L2-3 and L3-4. There are large areas of lucency within the posterior aspect of the L4 and L5 vertebrae. Paraspinal and other soft tissues: Calcific aortic atherosclerosis. Disc levels: L1-2: Partially calcified disc material and endplate spurring with mild spinal canal stenosis. L2-3: Endplate spurring with mild spinal canal  stenosis. L3-4: Severe facet hypertrophy. Disc bulge with endplate spurring. Severe spinal canal stenosis. L4-5: Severe facet hypertrophy with disc bulge and endplate spurring. Moderate spinal canal stenosis. L5-S1: No bony spinal canal stenosis. IMPRESSION: 1. Generalized osteopenia with multiple areas of somewhat focal demineralization. This appearance is exaggerated by photon starvation artifacts due to patient body habitus. Within these limitations, no acute fracture. 2. Severe multilevel degenerative disc disease with bulky osteophytosis causing fusion of long segments of the lower thoracic and lumbar spine. 3. Multilevel moderate-to-severe lumbar spinal canal stenosis. 4.  Aortic atherosclerosis (ICD10-I70.0). Electronically Signed   By: Deatra Robinson M.D.   On: 03/18/2020 04:04   DG Chest Portable 1 View  Result Date: 03/17/2020 CLINICAL DATA:  Peripheral edema EXAM: PORTABLE CHEST 1 VIEW COMPARISON:  January 15, 2020 FINDINGS: The heart size and mediastinal contours are within normal limits. Mildly increased interstitial markings are seen throughout both lungs. There is also slightly patchy airspace opacity seen within the right upper lobe. Aortic knob calcifications are seen.  No pleural effusion. No acute osseous abnormality. IMPRESSION: Interstitial/patchy airspace opacity within both lungs which could be due to edema and/or infectious etiology. Electronically Signed   By: Jonna Clark M.D.   On: 03/17/2020 23:20   ECHOCARDIOGRAM COMPLETE  Result Date: 03/18/2020    ECHOCARDIOGRAM REPORT   Patient Name:   LATERA MCLIN Youtsey Date of Exam: 03/18/2020 Medical Rec #:  [id number]        Height:       62.0 in Accession #:    [id number]       Weight:       274.7 lb Date of Birth:  [birth year]       BSA:          2.188 m Patient Age:    84 years         BP:           138/48 mmHg Patient Gender: F                HR:           69 bpm. Exam Location:  Inpatient Procedure: 2D Echo, Cardiac Doppler and Color  Doppler Indications:    CHF  History:        Patient has no prior history of Echocardiogram examinations. LE                 edema; Risk Factors:Hypertension, Morbid obesity and Diabetes.                 Recent COVID,.  Sonographer:    Lavenia Atlas Referring Phys: [id number] Marinda Elk  Sonographer Comments: Patient is morbidly obese. IMPRESSIONS  1. Normal LV systolic function; grade 1 diastolic dysfunction.  2. Left ventricular ejection fraction, by estimation, is 60 to 65%. The left ventricle has normal function. The left ventricle has no regional wall motion abnormalities. There is moderate left ventricular hypertrophy. Left ventricular diastolic parameters are consistent with Grade I diastolic dysfunction (impaired relaxation). Elevated left atrial pressure.  3. Right ventricular systolic function is normal. The right ventricular size is normal.  4. The mitral valve is normal in structure. No evidence of mitral valve regurgitation. No evidence of mitral stenosis.  5. The aortic valve is normal in structure. Aortic valve regurgitation is not visualized. No aortic stenosis is present.  6. The inferior vena cava is normal in size with greater than 50% respiratory variability, suggesting right atrial pressure of 3 mmHg. FINDINGS  Left Ventricle: Left ventricular ejection fraction, by estimation, is 60 to 65%. The left ventricle has normal function. The left ventricle has no regional wall motion abnormalities. The left ventricular internal cavity size was normal in size. There is  moderate left ventricular hypertrophy. Left ventricular diastolic parameters are consistent with Grade I diastolic dysfunction (impaired relaxation). Elevated left atrial pressure. Right Ventricle: The right ventricular size is normal.Right ventricular systolic function is normal. Left Atrium: Left atrial size was normal in size. Right Atrium: Right atrial size was normal in size. Pericardium: There is no evidence of pericardial  effusion. Mitral Valve: The mitral valve is normal in structure. Normal mobility of the mitral valve leaflets. Mild mitral annular calcification. No evidence of mitral valve regurgitation. No evidence of mitral valve stenosis. Tricuspid Valve: The tricuspid valve is normal in structure. Tricuspid valve regurgitation is trivial. No evidence of tricuspid stenosis. Aortic Valve: The aortic valve is normal in structure. Aortic valve regurgitation is not visualized. No aortic stenosis is present. Aortic valve mean gradient  measures 9.0 mmHg. Aortic valve peak gradient measures 12.8 mmHg. Aortic valve area, by VTI measures 1.53 cm. Pulmonic Valve: The pulmonic valve was not well visualized. Pulmonic valve regurgitation is not visualized. No evidence of pulmonic stenosis. Aorta: The aortic root is normal in size and structure. Venous: The inferior vena cava is normal in size with greater than 50% respiratory variability, suggesting right atrial pressure of 3 mmHg.  Additional Comments: Normal LV systolic function; grade 1 diastolic dysfunction.  LEFT VENTRICLE PLAX 2D LVIDd:         3.60 cm  Diastology LVIDs:         2.50 cm  LV e' lateral:   6.22 cm/s LV PW:         1.40 cm  LV E/e' lateral: 21.4 LV IVS:        1.50 cm  LV e' medial:    7.84 cm/s LVOT diam:     1.90 cm  LV E/e' medial:  17.0 LV SV:         73 LV SV Index:   33 LVOT Area:     2.84 cm  RIGHT VENTRICLE RV Basal diam:  3.10 cm RV S prime:     8.70 cm/s TAPSE (M-mode): 2.4 cm LEFT ATRIUM           Index       RIGHT ATRIUM           Index LA diam:      3.20 cm 1.46 cm/m  RA Area:     19.40 cm LA Vol (A2C): 56.5 ml 25.83 ml/m RA Volume:   61.40 ml  28.07 ml/m LA Vol (A4C): 61.0 ml 27.88 ml/m  AORTIC VALVE AV Area (Vmax):    1.77 cm AV Area (Vmean):   1.43 cm AV Area (VTI):     1.53 cm AV Vmax:           179.00 cm/s AV Vmean:          141.000 cm/s AV VTI:            0.476 m AV Peak Grad:      12.8 mmHg AV Mean Grad:      9.0 mmHg LVOT Vmax:          112.00 cm/s LVOT Vmean:        71.200 cm/s LVOT VTI:          0.257 m LVOT/AV VTI ratio: 0.54  AORTA Ao Root diam: 3.10 cm MITRAL VALVE MV Area (PHT): 3.17 cm     SHUNTS MV Decel Time: 239 msec     Systemic VTI:  0.26 m MV E velocity: 133.00 cm/s  Systemic Diam: 1.90 cm MV A velocity: 139.00 cm/s MV E/A ratio:  0.96 Olga Millers MD Electronically signed by Olga Millers MD Signature Date/Time: 03/18/2020/3:50:18 PM    Final    US Abdomen Limited RUQ  Result Date: 03/18/2020 CLINICAL DATA:  Abnormal liver function tests EXAM: ULTRASOUND ABDOMEN LIMITED RIGHT UPPER QUADRANT COMPARISON:  None. FINDINGS: Gallbladder: Gallbladder is surgically absent Common bile duct: Diameter: 6 mm Liver: Coarsened hepatic echotexture with mildly nodular contour. The liver parenchyma is echogenic. Portal vein is patent on color Doppler imaging with normal direction of blood flow towards the liver. Other: None. IMPRESSION: Hepatic steatosis and possible cirrhosis. Surgically absent gallbladder without biliary dilatation. Electronically Signed   By: Deatra Robinson M.D.   On: 03/18/2020 03:01     Subjective: Patient seen and examined at bedside,  resting comfortably.  No complaints this morning.  Continues with mild back pain which is improved with pain medication.  Ready for discharge to SNF today.  Denies headache, no fever/chills/night sweats, no nausea/vomiting/diarrhea, no chest pain, no palpitations, no shortness of breath, no abdominal pain, no cough/congestion.  No acute events overnight per nursing staff.  Discharge Exam: Vitals:   03/20/20 0628 03/20/20 1030  BP:  (!) 147/56  Pulse:  72  Resp:  18  Temp:  98.3 F (36.8 C)  SpO2: 98% 96%   Vitals:   03/19/20 2326 03/20/20 0454 03/20/20 0628 03/20/20 1030  BP:  110/61  (!) 147/56  Pulse:  66  72  Resp:  18  18  Temp:  97.7 F (36.5 C)  98.3 F (36.8 C)  TempSrc:  Oral  Oral  SpO2: 93% 95% 98% 96%  Weight:      Height:        General: Pt is alert,  awake, not in acute distress, obese Cardiovascular: RRR, S1/S2 +, no rubs, no gallops Respiratory: CTA bilaterally, no wheezing, no rhonchi, on 2 L nasal cannula Abdominal: Soft, NT, ND, bowel sounds + Extremities: Unna boots noted in place bilateral lower extremities    The results of significant diagnostics from this hospitalization (including imaging, microbiology, ancillary and laboratory) are listed below for reference.     Microbiology: Recent Results (from the past 240 hour(s))  Urine culture     Status: Abnormal   Collection Time: 03/17/20 11:31 PM   Specimen: Urine, Clean Catch  Result Value Ref Range Status   Specimen Description   Final    URINE, CLEAN CATCH Performed at [facility], [address], [place], [place] [address]    Special Requests   Final    NONE Performed at [facility], [address], [place], [place] [address]    Culture MULTIPLE SPECIES PRESENT, SUGGEST RECOLLECTION (A)  Final   Report Status 03/18/2020 FINAL  Final  SARS CORONAVIRUS 2 (TAT 6-24 HRS) Nasopharyngeal Nasopharyngeal Swab     Status: None   Collection Time: 03/18/20  2:27 AM   Specimen: Nasopharyngeal Swab  Result Value Ref Range Status   SARS Coronavirus 2 NEGATIVE NEGATIVE Final    Comment: (NOTE) SARS-CoV-2 target nucleic acids are NOT DETECTED. The SARS-CoV-2 RNA is generally detectable in upper and lower respiratory specimens during the acute phase of infection. Negative results do not preclude SARS-CoV-2 infection, do not rule out co-infections with other pathogens, and should not be used as the sole basis for treatment or other patient management decisions. Negative results must be combined with clinical observations, patient history, and epidemiological information. The expected result is Negative. Fact Sheet for Patients: HairSlick.no Fact Sheet for Healthcare  Providers: quierodirigir.com This test is not yet approved or cleared by the [place] FDA and  has been authorized for detection and/or diagnosis of SARS-CoV-2 by FDA under an Emergency Use Authorization (EUA). This EUA will remain  in effect (meaning this test can be used) for the duration of the COVID-19 declaration under Section 56 4(b)(1) of the Act, 21 U.S.C. section 360bbb-3(b)(1), unless the authorization is terminated or revoked sooner. Performed at [facility], [address], [place], [place] [address]   Respiratory Panel by RT PCR (Flu A&B, Covid) - Nasopharyngeal Swab     Status: None   Collection Time: 03/19/20  4:04 PM   Specimen: Nasopharyngeal Swab  Result Value Ref Range Status   SARS  Coronavirus 2 by RT PCR NEGATIVE NEGATIVE Final    Comment: (NOTE) SARS-CoV-2 target nucleic acids are NOT DETECTED. The SARS-CoV-2 RNA is generally detectable in upper respiratoy specimens during the acute phase of infection. The lowest concentration of SARS-CoV-2 viral copies this assay can detect is 131 copies/mL. A negative result does not preclude SARS-Cov-2 infection and should not be used as the sole basis for treatment or other patient management decisions. A negative result may occur with  improper specimen collection/handling, submission of specimen other than nasopharyngeal swab, presence of viral mutation(s) within the areas targeted by this assay, and inadequate number of viral copies (<131 copies/mL). A negative result must be combined with clinical observations, patient history, and epidemiological information. The expected result is Negative. Fact Sheet for Patients:  https://www.moore.com/https://www.fda.gov/media/142436/download Fact Sheet for Healthcare Providers:  https://www.young.biz/https://www.fda.gov/media/142435/download This test is not yet ap proved or cleared by the [place] FDA and  has been authorized for detection and/or diagnosis of SARS-CoV-2 by FDA  under an Emergency Use Authorization (EUA). This EUA will remain  in effect (meaning this test can be used) for the duration of the COVID-19 declaration under Section 564(b)(1) of the Act, 21 U.S.C. section 360bbb-3(b)(1), unless the authorization is terminated or revoked sooner.    Influenza A by PCR NEGATIVE NEGATIVE Final   Influenza B by PCR NEGATIVE NEGATIVE Final    Comment: (NOTE) The Xpert Xpress SARS-CoV-2/FLU/RSV assay is intended as an aid in  the diagnosis of influenza from Nasopharyngeal swab specimens and  should not be used as a sole basis for treatment. Nasal washings and  aspirates are unacceptable for Xpert Xpress SARS-CoV-2/FLU/RSV  testing. Fact Sheet for Patients: https://www.moore.com/https://www.fda.gov/media/142436/download Fact Sheet for Healthcare Providers: https://www.young.biz/https://www.fda.gov/media/142435/download This test is not yet approved or cleared by the [place] FDA and  has been authorized for detection and/or diagnosis of SARS-CoV-2 by  FDA under an Emergency Use Authorization (EUA). This EUA will remain  in effect (meaning this test can be used) for the duration of the  Covid-19 declaration under Section 564(b)(1) of the Act, 21  U.S.C. section 360bbb-3(b)(1), unless the authorization is  terminated or revoked. Performed at [facility], [address] [place], [place] [id number]      Labs: BNP (last 3 results) Recent Labs    11/27/19 1351 01/15/20 1610 03/17/20 2331  BNP 50.5 100.2* 79.1   Basic Metabolic Panel: Recent Labs  Lab 03/17/20 2331 03/18/20 0457 03/19/20 0403 03/20/20 0847  NA 140 141 137 130*  K 5.0 5.0 4.8 5.8*  CL 100 100 97* 92*  CO2 31 33* 31 30  GLUCOSE 90 102* 90 91  BUN 30* 28* 31* 39*  CREATININE 1.40* 1.41* 1.63* 2.07*  CALCIUM 9.2 9.2 8.4* 8.0*  MG  --  2.3 2.0  --    Liver Function Tests: Recent Labs  Lab 03/17/20 2331 03/18/20 0457  AST 36 37  ALT 23 24  ALKPHOS 304* 304*  BILITOT 0.8 0.9  PROT 6.9 6.9   ALBUMIN 3.1* 2.9*   No results for input(s): LIPASE, AMYLASE in the last 168 hours. No results for input(s): AMMONIA in the last 168 hours. CBC: Recent Labs  Lab 03/17/20 2331 03/18/20 0457 03/19/20 0403  WBC 11.2* 10.4 10.4  NEUTROABS 6.7 6.4  --   HGB 12.0 12.0 11.0*  HCT 38.7 38.4 35.7*  MCV 99.0 99.0 99.4  PLT 244 229 213   Cardiac Enzymes: No results for input(s): CKTOTAL, CKMB, CKMBINDEX, TROPONINI in the last  168 hours. BNP: Invalid input(s): POCBNP CBG: Recent Labs  Lab 03/19/20 1751 03/19/20 2119 03/20/20 0806 03/20/20 0906 03/20/20 1202  GLUCAP 124* 118* 66* 80 118*   D-Dimer No results for input(s): DDIMER in the last 72 hours. Hgb A1c Recent Labs    03/18/20 0457  HGBA1C 5.8*   Lipid Profile No results for input(s): CHOL, HDL, LDLCALC, TRIG, CHOLHDL, LDLDIRECT in the last 72 hours. Thyroid function studies Recent Labs    03/18/20 0457  TSH 5.881*   Anemia work up No results for input(s): VITAMINB12, FOLATE, FERRITIN, TIBC, IRON, RETICCTPCT in the last 72 hours. Urinalysis    Component Value Date/Time   COLORURINE STRAW (A) 03/17/2020 2331   APPEARANCEUR CLEAR 03/17/2020 2331   LABSPEC 1.009 03/17/2020 2331   PHURINE 8.0 03/17/2020 2331   GLUCOSEU NEGATIVE 03/17/2020 2331   HGBUR NEGATIVE 03/17/2020 2331   BILIRUBINUR NEGATIVE 03/17/2020 2331   KETONESUR NEGATIVE 03/17/2020 2331   PROTEINUR NEGATIVE 03/17/2020 2331   UROBILINOGEN 0.2 06/11/2008 1105   NITRITE NEGATIVE 03/17/2020 2331   LEUKOCYTESUR NEGATIVE 03/17/2020 2331   Sepsis Labs Invalid input(s): PROCALCITONIN,  WBC,  LACTICIDVEN Microbiology Recent Results (from the past 240 hour(s))  Urine culture     Status: Abnormal   Collection Time: 03/17/20 11:31 PM   Specimen: Urine, Clean Catch  Result Value Ref Range Status   Specimen Description   Final    URINE, CLEAN CATCH Performed at [facility], [address], [place], [place] [address]    Special  Requests   Final    NONE Performed at [facility], [address], [place], [place] [address]    Culture MULTIPLE SPECIES PRESENT, SUGGEST RECOLLECTION (A)  Final   Report Status 03/18/2020 FINAL  Final  SARS CORONAVIRUS 2 (TAT 6-24 HRS) Nasopharyngeal Nasopharyngeal Swab     Status: None   Collection Time: 03/18/20  2:27 AM   Specimen: Nasopharyngeal Swab  Result Value Ref Range Status   SARS Coronavirus 2 NEGATIVE NEGATIVE Final    Comment: (NOTE) SARS-CoV-2 target nucleic acids are NOT DETECTED. The SARS-CoV-2 RNA is generally detectable in upper and lower respiratory specimens during the acute phase of infection. Negative results do not preclude SARS-CoV-2 infection, do not rule out co-infections with other pathogens, and should not be used as the sole basis for treatment or other patient management decisions. Negative results must be combined with clinical observations, patient history, and epidemiological information. The expected result is Negative. Fact Sheet for Patients: HairSlick.no Fact Sheet for Healthcare Providers: quierodirigir.com This test is not yet approved or cleared by the [place] FDA and  has been authorized for detection and/or diagnosis of SARS-CoV-2 by FDA under an Emergency Use Authorization (EUA). This EUA will remain  in effect (meaning this test can be used) for the duration of the COVID-19 declaration under Section 56 4(b)(1) of the Act, 21 U.S.C. section 360bbb-3(b)(1), unless the authorization is terminated or revoked sooner. Performed at [facility], [address] [place], [place] [address]   Respiratory Panel by RT PCR (Flu A&B, Covid) - Nasopharyngeal Swab     Status: None   Collection Time: 03/19/20  4:04 PM   Specimen: Nasopharyngeal Swab  Result Value Ref Range Status   SARS Coronavirus 2 by RT PCR NEGATIVE NEGATIVE Final    Comment: (NOTE) SARS-CoV-2  target nucleic acids are NOT DETECTED. The SARS-CoV-2 RNA is generally detectable in upper respiratoy specimens during the acute phase of infection. The  lowest concentration of SARS-CoV-2 viral copies this assay can detect is 131 copies/mL. A negative result does not preclude SARS-Cov-2 infection and should not be used as the sole basis for treatment or other patient management decisions. A negative result may occur with  improper specimen collection/handling, submission of specimen other than nasopharyngeal swab, presence of viral mutation(s) within the areas targeted by this assay, and inadequate number of viral copies (<131 copies/mL). A negative result must be combined with clinical observations, patient history, and epidemiological information. The expected result is Negative. Fact Sheet for Patients:  https://www.moore.com/ Fact Sheet for Healthcare Providers:  https://www.young.biz/ This test is not yet ap proved or cleared by the [place] FDA and  has been authorized for detection and/or diagnosis of SARS-CoV-2 by FDA under an Emergency Use Authorization (EUA). This EUA will remain  in effect (meaning this test can be used) for the duration of the COVID-19 declaration under Section 564(b)(1) of the Act, 21 U.S.C. section 360bbb-3(b)(1), unless the authorization is terminated or revoked sooner.    Influenza A by PCR NEGATIVE NEGATIVE Final   Influenza B by PCR NEGATIVE NEGATIVE Final    Comment: (NOTE) The Xpert Xpress SARS-CoV-2/FLU/RSV assay is intended as an aid in  the diagnosis of influenza from Nasopharyngeal swab specimens and  should not be used as a sole basis for treatment. Nasal washings and  aspirates are unacceptable for Xpert Xpress SARS-CoV-2/FLU/RSV  testing. Fact Sheet for Patients: https://www.moore.com/ Fact Sheet for Healthcare Providers: https://www.young.biz/ This test is  not yet approved or cleared by the [place] FDA and  has been authorized for detection and/or diagnosis of SARS-CoV-2 by  FDA under an Emergency Use Authorization (EUA). This EUA will remain  in effect (meaning this test can be used) for the duration of the  Covid-19 declaration under Section 564(b)(1) of the Act, 21  U.S.C. section 360bbb-3(b)(1), unless the authorization is  terminated or revoked. Performed at [facility], [address] [place], [place] [address]      Time coordinating discharge: Over 30 minutes  SIGNED:   Alvira Philips [place], DO  [facility] 03/20/2020, 2:41 PM

## 2020-03-20 NOTE — Progress Notes (Signed)
RN attempted to call report to [employer]. Front desk asked for # to call back.

## 2020-03-20 NOTE — TOC Transition Note (Addendum)
Transition of Care [facility]) - CM/SW Discharge Note   Patient Details  Name: Brandy Morris MRN: [id number] Date of Birth: 16-Jan-1935  [facility] CM/SW Contact:  Clearance Coots, LCSW Phone Number: 03/20/2020, 11:56 AM   Clinical Narrative:    Authorization received # [id number] Nurse call report to: [phone] Room:513 PTAR to transport.    Final next level of care: Skilled Nursing Facility Barriers to Discharge: Continued Medical Work up   Patient Goals and CMS Choice Patient states their goals for this hospitalization and ongoing recovery are:: Have less pain in my back CMS Medicare.gov Compare Post Acute Care list provided to:: Patient Choice offered to / list presented to : Patient  Discharge Placement PASRR number recieved: 03/19/20            Patient chooses bed at: [facility] Patient to be transferred to facility by: PTAR Name of family member notified: Daughter Jasmine December Patient and family notified of of transfer: 03/20/20  Discharge Plan and Services In-house Referral: NA Discharge Planning Services: NA Post Acute Care Choice: Skilled Nursing Facility          DME Arranged: N/A DME Agency: NA       HH Arranged: NA HH Agency: NA        Social Determinants of Health (SDOH) Interventions     Readmission Risk Interventions No flowsheet data found.

## 2020-03-20 NOTE — Progress Notes (Signed)
Physical Therapy Treatment Patient Details Name: Brandy Morris MRN: [id number] DOB: 1935-06-25 Today's Date: 03/20/2020    History of Present Illness 85 year old female with past medical history of diabetes mellitus type 2, hypertension, morbid obesity, lymphedema of the lower extremities, gastroesophageal reflux disease, hypothyroidism, chronic kidney disease stage IIIa and recent COVID-19 infection in January 2021 who presents to [facility] emergency department with complaints of low back pain    PT Comments    +2 total assist for supine to sit (pt 15%). MOd/max A of 2 for sit to stand with Stedy. Pt stood in [place] for ~5 minutes and performed weight shifting, reaching, and postural exercises. Pt's bed was saturated in urine upon my arrival (RN notified). Pt puts forth good effort and is very pleasant. I Continue to recommend SNF.    Follow Up Recommendations  SNF;Supervision/Assistance - 24 hour     Equipment Recommendations  None recommended by PT    Recommendations for Other Services OT consult     Precautions / Restrictions Precautions Precautions: Fall Precaution Comments: back pain, urinary urgency Restrictions Weight Bearing Restrictions: No    Mobility  Bed Mobility Overal bed mobility: Needs Assistance Bed Mobility: Supine to Sit     Supine to sit: Total assist;+2 for physical assistance     General bed mobility comments: assist to raise trunk, pt has limited use of LUE, she was unable to reach for bedrail with LUE for attempt to roll R  Transfers Overall transfer level: Needs assistance   Transfers: Sit to/from Stand;Stand Pivot Transfers   Stand pivot transfers: +2 physical assistance;From elevated surface;Mod assist;Max assist       General transfer comment: +2 mod/max A to stand from elevated bed, pt  stood in [place] for ~5 minutes for back/pericare (she was lying in bed saturated with urine), performed L/R weight shifting in standing and  forward reaching BUEs, frequent VCs for posture as she tends to flex neck and trunk, pt able to come to full upright standing with VCs and BUE support on stedy  Ambulation/Gait                 Stairs             Wheelchair Mobility    Modified Rankin (Stroke Patients Only)       Balance Overall balance assessment: Needs assistance Sitting-balance support: Feet supported;Bilateral upper extremity supported Sitting balance-Leahy Scale: Poor Sitting balance - Comments: posterior lean requiring min/mod A  initially then fair after ~2 minutes     Standing balance-Leahy Scale: Poor Standing balance comment: reliant on external support from therapy team                            Cognition Arousal/Alertness: Awake/alert Behavior During Therapy: WFL for tasks assessed/performed Overall Cognitive Status: Within Functional Limits for tasks assessed                                        Exercises      General Comments        Pertinent Vitals/Pain Pain Assessment: Faces Faces Pain Scale: Hurts little more Pain Location: back with movement Pain Descriptors / Indicators: Grimacing Pain Intervention(s): Limited activity within patient's tolerance;Monitored during session;Repositioned    Home Living  Prior Function            PT Goals (current goals can now be found in the care plan section) Acute Rehab PT Goals Patient Stated Goal: I want to go home, My husband will miss me PT Goal Formulation: With patient Time For Goal Achievement: 04/01/20 Potential to Achieve Goals: Fair Progress towards PT goals: Progressing toward goals    Frequency    Min 2X/week      PT Plan Current plan remains appropriate    Co-evaluation              AM-PAC PT "6 Clicks" Mobility   Outcome Measure  Help needed turning from your back to your side while in a flat bed without using bedrails?: Total Help  needed moving from lying on your back to sitting on the side of a flat bed without using bedrails?: Total Help needed moving to and from a bed to a chair (including a wheelchair)?: Total Help needed standing up from a chair using your arms (e.g., wheelchair or bedside chair)?: Total Help needed to walk in hospital room?: Total Help needed climbing 3-5 steps with a railing? : Total 6 Click Score: 6    End of Session Equipment Utilized During Treatment: Gait belt Activity Tolerance: Patient limited by pain Patient left: with call bell/phone within reach;in chair;with chair alarm set Nurse Communication: Mobility status PT Visit Diagnosis: Unsteadiness on feet (R26.81);Difficulty in walking, not elsewhere classified (R26.2);Pain     Time: 1610-9604 PT Time Calculation (min) (ACUTE ONLY): 23 min  Charges:  $Therapeutic Activity: 23-37 mins                     Blondell Reveal Kistler PT 03/20/2020  Acute Rehabilitation Services Pager [phone] Office [phone]

## 2020-03-21 ENCOUNTER — Encounter: Payer: Self-pay | Admitting: Internal Medicine

## 2020-03-21 ENCOUNTER — Non-Acute Institutional Stay (SKILLED_NURSING_FACILITY): Payer: Medicare HMO | Admitting: Internal Medicine

## 2020-03-21 DIAGNOSIS — R918 Other nonspecific abnormal finding of lung field: Secondary | ICD-10-CM | POA: Diagnosis not present

## 2020-03-21 DIAGNOSIS — M48061 Spinal stenosis, lumbar region without neurogenic claudication: Secondary | ICD-10-CM

## 2020-03-21 DIAGNOSIS — I890 Lymphedema, not elsewhere classified: Secondary | ICD-10-CM

## 2020-03-21 DIAGNOSIS — M549 Dorsalgia, unspecified: Secondary | ICD-10-CM | POA: Diagnosis not present

## 2020-03-21 DIAGNOSIS — L89312 Pressure ulcer of right buttock, stage 2: Secondary | ICD-10-CM

## 2020-03-21 DIAGNOSIS — N189 Chronic kidney disease, unspecified: Secondary | ICD-10-CM

## 2020-03-21 DIAGNOSIS — N179 Acute kidney failure, unspecified: Secondary | ICD-10-CM

## 2020-03-21 DIAGNOSIS — I10 Essential (primary) hypertension: Secondary | ICD-10-CM

## 2020-03-21 DIAGNOSIS — K219 Gastro-esophageal reflux disease without esophagitis: Secondary | ICD-10-CM

## 2020-03-21 DIAGNOSIS — E039 Hypothyroidism, unspecified: Secondary | ICD-10-CM

## 2020-03-21 MED ORDER — TRAMADOL HCL 50 MG PO TABS: 50.0000 mg | ORAL_TABLET | Freq: Two times a day (BID) | ORAL | 0 refills | Status: DC

## 2020-03-21 NOTE — Progress Notes (Signed)
: Provider:  Merrilee Seashore, MD Location:  [facility] Room Number: 513-P Place of Service:  SNF ([phone])  PCP: [name], MD Patient Care Team: [name], MD as PCP - General (Internal Medicine)  Extended Emergency Contact Information Primary Emergency Contact: [name] Address: [address]          [place], [place] [address] [name] of [place] Home Phone: [phone] Relation: Spouse Secondary Emergency Contact: Anderson,Sharon Home Phone: [phone] Mobile Phone: [phone] Relation: Daughter     Allergies: Ciprofloxacin, Codeine, Latex, and Povidone-iodine  Chief Complaint  Patient presents with  . New Admit To SNF    New admission to [facility] SNF    HPI: Patient is an 85 y.o. female with diabetes mellitus type 2, and hypertension, bilateral lower extremity lymphedema, morbid obesity, GERD, hypothyroidism, CKD stage IIIa, recent COVID-19 infection January 2021 who presented to the ED with complaint of low back pain.  Pain was sharp in quality with nonradiation, worse with movement, improved with rest, no lower extremity weakness.  Symptoms worsen until patient is having difficulty ambulating.  Patient admitted to [facility] long hospital from 4/26-29 where she was treated for intractable low back pain from degenerative disc disease and lumbar spinal stenosis as well as bilateral lower extremity lymphedema that was treated with Unna boots.  Patient did have the finding of bilateral pulmonary infiltrates on chest x-ray that were felt to be secondary to her prior Covid infection.  Patient is admitted to skilled nursing facility for OT/PT.  While at skilled nursing facility patient will be followed for hypertension treated with Norvasc Coreg Lasix and lisinopril, hypothyroidism treated with Synthroid, and GERD treated with omeprazole.  Past Medical History:  Diagnosis Date  . Diabetes mellitus without complication (HCC)   .  GERD (gastroesophageal reflux disease)   . History of COVID-19 03/18/2020  . Hypertension     Past Surgical History:  Procedure Laterality Date  . ABDOMINAL HYSTERECTOMY    . Bladder Tack    . CHOLECYSTECTOMY    . JOINT REPLACEMENT    . THYROIDECTOMY    . TUBAL LIGATION      Allergies as of 03/21/2020      Reactions   Ciprofloxacin Hives   Codeine    Don't like the way it makes me feel.   Latex Itching, Rash, Other (See Comments)   Povidone-iodine Rash   Other reaction(s): Other (See Comments) unknown      Medication List       Accurate as of March 21, 2020 11:51 PM. If you have any questions, ask your nurse or doctor.        albuterol 108 (90 Base) MCG/ACT inhaler Commonly known as: VENTOLIN HFA Inhale 2 puffs into the lungs every 6 (six) hours as needed for wheezing or shortness of breath.   amLODipine 5 MG tablet Commonly known as: NORVASC Take 5 mg by mouth every evening.   BLOOD GLUCOSE TEST STRIPS Strp 1 strip by Other route 3 (three) times a week.   carvedilol 6.25 MG tablet Commonly known as: COREG Take 12.5 mg by mouth at bedtime.   furosemide 40 MG tablet Commonly known as: LASIX Take 40 mg by mouth daily.   glimepiride 1 MG tablet Commonly known as: AMARYL Take 1 mg by mouth daily.   levothyroxine 150 MCG tablet Commonly known as: SYNTHROID Take 150 mcg by mouth every evening.   nystatin powder Commonly known as: MYCOSTATIN/NYSTOP Apply 1  application topically 3 (three) times daily as needed for rash. Apply to groin area tid prn for yeast in groin area.   omeprazole 20 MG capsule Commonly known as: PRILOSEC Take 20 mg by mouth daily.   traMADol 50 MG tablet Commonly known as: ULTRAM Take 1 tablet (50 mg total) by mouth 2 (two) times daily. What changed:   when to take this  reasons to take this Changed by: Merrilee Seashore, MD   triamcinolone cream 0.1 % Commonly known as: KENALOG Apply 1 application topically daily as needed  (rash).       Meds ordered this encounter  Medications  . traMADol (ULTRAM) 50 MG tablet    Sig: Take 1 tablet (50 mg total) by mouth 2 (two) times daily.    Dispense:  40 tablet    Refill:  0     There is no immunization history on file for this patient.  Social History   Tobacco Use  . Smoking status: Never Smoker  . Smokeless tobacco: Never Used  Substance Use Topics  . Alcohol use: No    Family history is   Family History  Problem Relation Age of Onset  . Pneumonia Mother       Review of Systems  GENERAL:  no fevers, fatigue, appetite changes SKIN: No itching, or rash EYES: No eye pain, redness, discharge EARS: No earache, tinnitus, change in hearing NOSE: No congestion, drainage or bleeding  MOUTH/THROAT: No mouth or tooth pain, No sore throat RESPIRATORY: No cough, wheezing, SOB CARDIAC: No chest pain, palpitations, lower extremity edema  GI: No abdominal pain, No N/V/D or constipation, No heartburn or reflux  GU: No dysuria, frequency or urgency, or incontinence  MUSCULOSKELETAL: No unrelieved bone/joint pain NEUROLOGIC: No headache, dizziness or focal weakness PSYCHIATRIC: No c/o anxiety or sadness   Vitals:   03/21/20 1613  BP: 123/63  Pulse: 72  Resp: 18  Temp: 98.3 F (36.8 C)    SpO2 Readings from Last 1 Encounters:  03/20/20 96%   There is no height or weight on file to calculate BMI.     Physical Exam  GENERAL APPEARANCE: Alert, conversant,  No acute distress.  SKIN: Stage III right gluteal injury, dressed not visualized HEAD: Normocephalic, atraumatic  EYES: Conjunctiva/lids clear. Pupils round, reactive. EOMs intact.  EARS: External exam WNL, canals clear. Hearing grossly normal.  NOSE: No deformity or discharge.  MOUTH/THROAT: Lips w/o lesions  RESPIRATORY: Breathing is even, unlabored. Lung sounds are clear   CARDIOVASCULAR: Heart RRR no murmurs, rubs or gallops. No peripheral edema.   GASTROINTESTINAL: Abdomen is soft,  non-tender, not distended w/ normal bowel sounds. GENITOURINARY: Bladder non tender, not distended  MUSCULOSKELETAL: No abnormal joints or musculature NEUROLOGIC:  Cranial nerves 2-12 grossly intact. Moves all extremities  PSYCHIATRIC: Mood and affect appropriate to situation, no behavioral issues  Patient Active Problem List   Diagnosis Date Noted  . Chronic kidney disease, stage 3a 03/18/2020  . Intractable back pain 03/18/2020  . Lymphedema of both lower extremities 03/18/2020  . Pressure injury of skin 11/28/2019  . GERD (gastroesophageal reflux disease) 11/27/2019  . Hypothyroidism 11/27/2019  . Type 2 diabetes mellitus with stage 3 chronic kidney disease, without long-term current use of insulin (HCC) 11/27/2019  . Essential hypertension 11/27/2019      Labs reviewed: Basic Metabolic Panel:    Component Value Date/Time   NA 130 (L) 03/20/2020 0847   K 5.8 (H) 03/20/2020 0847   CL 92 (L) 03/20/2020 9604  CO2 30 03/20/2020 0847   GLUCOSE 91 03/20/2020 0847   BUN 39 (H) 03/20/2020 0847   CREATININE 2.07 (H) 03/20/2020 0847   CALCIUM 8.0 (L) 03/20/2020 0847   PROT 6.9 03/18/2020 0457   ALBUMIN 2.9 (L) 03/18/2020 0457   AST 37 03/18/2020 0457   ALT 24 03/18/2020 0457   ALKPHOS 304 (H) 03/18/2020 0457   BILITOT 0.9 03/18/2020 0457   GFRNONAA 22 (L) 03/20/2020 0847   GFRAA 25 (L) 03/20/2020 0847    Recent Labs    11/29/19 0250 11/29/19 0250 11/30/19 0251 11/30/19 0251 12/01/19 0307 01/15/20 1610 03/18/20 0457 03/19/20 0403 03/20/20 0847  NA 137   < > 138   < > 141   < > 141 137 130*  K 4.6   < > 5.1   < > 5.4*   < > 5.0 4.8 5.8*  CL 100   < > 101   < > 103   < > 100 97* 92*  CO2 27   < > 27   < > 30   < > 33* 31 30  GLUCOSE 154*   < > 139*   < > 119*   < > 102* 90 91  BUN 23   < > 33*   < > 39*   < > 28* 31* 39*  CREATININE 1.20*   < > 1.13*   < > 1.15*   < > 1.41* 1.63* 2.07*  CALCIUM 8.3*   < > 8.3*   < > 8.8*   < > 9.2 8.4* 8.0*  MG 2.3   < > 2.2   < >  2.4  --  2.3 2.0  --   PHOS 2.6  --  3.7  --  3.6  --   --   --   --    < > = values in this interval not displayed.   Liver Function Tests: Recent Labs    01/15/20 1610 03/17/20 2331 03/18/20 0457  AST 51* 36 37  ALT 40 23 24  ALKPHOS 296* 304* 304*  BILITOT 0.7 0.8 0.9  PROT 7.3 6.9 6.9  ALBUMIN 3.0* 3.1* 2.9*   No results for input(s): LIPASE, AMYLASE in the last 8760 hours. No results for input(s): AMMONIA in the last 8760 hours. CBC: Recent Labs    12/01/19 0307 01/15/20 1610 03/17/20 2331 03/18/20 0457 03/19/20 0403  WBC 11.7*   < > 11.2* 10.4 10.4  NEUTROABS 8.6*  --  6.7 6.4  --   HGB 13.3   < > 12.0 12.0 11.0*  HCT 43.3   < > 38.7 38.4 35.7*  MCV 97.5   < > 99.0 99.0 99.4  PLT 236   < > 244 229 213   < > = values in this interval not displayed.   Lipid Recent Labs    11/27/19 1351  TRIG 67    Cardiac Enzymes: No results for input(s): CKTOTAL, CKMB, CKMBINDEX, TROPONINI in the last 8760 hours. BNP: Recent Labs    11/27/19 1351 01/15/20 1610 03/17/20 2331  BNP 50.5 100.2* 79.1   No results found for: [facility] Lab Results  Component Value Date   HGBA1C 5.8 (H) 03/18/2020   Lab Results  Component Value Date   TSH 5.881 (H) 03/18/2020   No results found for: VITAMINB12 No results found for: FOLATE Lab Results  Component Value Date   FERRITIN 50 12/01/2019    Imaging and Procedures obtained prior to SNF admission: CT CHEST WO  CONTRAST  Result Date: 03/18/2020 CLINICAL DATA:  Pain from the left shoulder bleed to the left hip for 2 weeks. EXAM: CT CHEST WITHOUT CONTRAST TECHNIQUE: Multidetector CT imaging of the chest was performed following the standard protocol without IV contrast. COMPARISON:  November 27, 2019 FINDINGS: Cardiovascular: There is moderate severity calcification of the aortic arch. Normal heart size. No pericardial effusion. Marked severity coronary artery calcification is seen. Mediastinum/Nodes: No enlarged mediastinal or  axillary lymph nodes. Lungs/Pleura: A new 5 mm noncalcified lung nodule is seen within the anterolateral aspect of the right upper lobe (axial CT image 55, CT series number 5). 6 mm and 7 mm noncalcified lung nodule seen within the posterior aspect of the left apex. This represents a new finding when compared to the prior exam. A stable 6 mm noncalcified lung nodule is seen within the posterior aspect of the left lower lobe (axial CT image 70, CT series number 5). A 6 mm noncalcified lung nodule is seen within the anterolateral aspect of the left lower lobe (axial CT image 82, CT series number 5). This represents a new finding when compared to the prior study. Stable 4 mm and 5 mm noncalcified lung nodules are seen within the right middle lobe. A small patchy area of atelectasis and/or early infiltrate is seen within the posteromedial aspect of the left lower lobe. There is no evidence of a pleural effusion or pneumothorax. Upper Abdomen: There is a moderate-sized hiatal hernia. Musculoskeletal: Multilevel degenerative changes seen throughout the thoracic spine. IMPRESSION: 1. Multiple new subcentimeter bilateral noncalcified lung nodules. Correlation with 3 month follow-up chest CT is recommended to determine stability. 2. Small patchy area of atelectasis and/or early infiltrate within the posteromedial aspect of the left lower lobe. 3. Moderate-sized hiatal hernia. Aortic Atherosclerosis (ICD10-I70.0). Electronically Signed   By: Virgina Norfolk M.D.   On: 03/18/2020 04:06   CT THORACIC SPINE WO CONTRAST  Result Date: 03/18/2020 CLINICAL DATA:  Back pain EXAM: CT THORACIC AND LUMBAR SPINE WITHOUT CONTRAST TECHNIQUE: Multidetector CT imaging of the thoracic and lumbar spine was performed without contrast. Multiplanar CT image reconstructions were also generated. COMPARISON:  CTA chest 11/27/2019, CT abdomen pelvis 08/08/2019 FINDINGS: CT THORACIC SPINE FINDINGS Alignment: Mild right convex asymmetry  Vertebrae: Bones are diffusely osteopenic. At the T11 and T12 levels, there is lucency of the anterior vertebral bodies. Paraspinal and other soft tissues: Please see report for dedicated CT of the chest. Disc levels: There is no bony spinal canal stenosis. CT LUMBAR SPINE FINDINGS Segmentation: 5 lumbar type vertebrae. Alignment: Grade 1 retrolisthesis at L3-4 and grade 1 anterolisthesis at L4-5. Mild left convex asymmetry. Vertebrae: Osteopenia. Bulky osteophytes at L1-2, L2-3 and L3-4. There are large areas of lucency within the posterior aspect of the L4 and L5 vertebrae. Paraspinal and other soft tissues: Calcific aortic atherosclerosis. Disc levels: L1-2: Partially calcified disc material and endplate spurring with mild spinal canal stenosis. L2-3: Endplate spurring with mild spinal canal stenosis. L3-4: Severe facet hypertrophy. Disc bulge with endplate spurring. Severe spinal canal stenosis. L4-5: Severe facet hypertrophy with disc bulge and endplate spurring. Moderate spinal canal stenosis. L5-S1: No bony spinal canal stenosis. IMPRESSION: 1. Generalized osteopenia with multiple areas of somewhat focal demineralization. This appearance is exaggerated by photon starvation artifacts due to patient body habitus. Within these limitations, no acute fracture. 2. Severe multilevel degenerative disc disease with bulky osteophytosis causing fusion of long segments of the lower thoracic and lumbar spine. 3. Multilevel moderate-to-severe lumbar spinal canal stenosis. 4.  Aortic atherosclerosis (ICD10-I70.0). Electronically Signed   By: Deatra Robinson M.D.   On: 03/18/2020 04:04   CT LUMBAR SPINE WO CONTRAST  Result Date: 03/18/2020 CLINICAL DATA:  Back pain EXAM: CT THORACIC AND LUMBAR SPINE WITHOUT CONTRAST TECHNIQUE: Multidetector CT imaging of the thoracic and lumbar spine was performed without contrast. Multiplanar CT image reconstructions were also generated. COMPARISON:  CTA chest 11/27/2019, CT abdomen  pelvis 08/08/2019 FINDINGS: CT THORACIC SPINE FINDINGS Alignment: Mild right convex asymmetry Vertebrae: Bones are diffusely osteopenic. At the T11 and T12 levels, there is lucency of the anterior vertebral bodies. Paraspinal and other soft tissues: Please see report for dedicated CT of the chest. Disc levels: There is no bony spinal canal stenosis. CT LUMBAR SPINE FINDINGS Segmentation: 5 lumbar type vertebrae. Alignment: Grade 1 retrolisthesis at L3-4 and grade 1 anterolisthesis at L4-5. Mild left convex asymmetry. Vertebrae: Osteopenia. Bulky osteophytes at L1-2, L2-3 and L3-4. There are large areas of lucency within the posterior aspect of the L4 and L5 vertebrae. Paraspinal and other soft tissues: Calcific aortic atherosclerosis. Disc levels: L1-2: Partially calcified disc material and endplate spurring with mild spinal canal stenosis. L2-3: Endplate spurring with mild spinal canal stenosis. L3-4: Severe facet hypertrophy. Disc bulge with endplate spurring. Severe spinal canal stenosis. L4-5: Severe facet hypertrophy with disc bulge and endplate spurring. Moderate spinal canal stenosis. L5-S1: No bony spinal canal stenosis. IMPRESSION: 1. Generalized osteopenia with multiple areas of somewhat focal demineralization. This appearance is exaggerated by photon starvation artifacts due to patient body habitus. Within these limitations, no acute fracture. 2. Severe multilevel degenerative disc disease with bulky osteophytosis causing fusion of long segments of the lower thoracic and lumbar spine. 3. Multilevel moderate-to-severe lumbar spinal canal stenosis. 4.  Aortic atherosclerosis (ICD10-I70.0). Electronically Signed   By: Deatra Robinson M.D.   On: 03/18/2020 04:04   DG Chest Portable 1 View  Result Date: 03/17/2020 CLINICAL DATA:  Peripheral edema EXAM: PORTABLE CHEST 1 VIEW COMPARISON:  January 15, 2020 FINDINGS: The heart size and mediastinal contours are within normal limits. Mildly increased interstitial  markings are seen throughout both lungs. There is also slightly patchy airspace opacity seen within the right upper lobe. Aortic knob calcifications are seen. No pleural effusion. No acute osseous abnormality. IMPRESSION: Interstitial/patchy airspace opacity within both lungs which could be due to edema and/or infectious etiology. Electronically Signed   By: Jonna Clark M.D.   On: 03/17/2020 23:20   ECHOCARDIOGRAM COMPLETE  Result Date: 03/18/2020    ECHOCARDIOGRAM REPORT   Patient Name:   Brandy Morris Date of Exam: 03/18/2020 Medical Rec #:  [id number]        Height:       62.0 in Accession #:    [id number]       Weight:       274.7 lb Date of Birth:  20-May-1935       BSA:          2.188 m Patient Age:    84 years         BP:           138/48 mmHg Patient Gender: F                HR:           69 bpm. Exam Location:  Inpatient Procedure: 2D Echo, Cardiac Doppler and Color Doppler Indications:    CHF  History:        Patient has no prior history of Echocardiogram  examinations. LE                 edema; Risk Factors:Hypertension, Morbid obesity and Diabetes.                 Recent COVID,.  Sonographer:    Lavenia AtlasBrooke Strickland Referring Phys: [id number] Marinda ElkGEORGE J SHALHOUB  Sonographer Comments: Patient is morbidly obese. IMPRESSIONS  1. Normal LV systolic function; grade 1 diastolic dysfunction.  2. Left ventricular ejection fraction, by estimation, is 60 to 65%. The left ventricle has normal function. The left ventricle has no regional wall motion abnormalities. There is moderate left ventricular hypertrophy. Left ventricular diastolic parameters are consistent with Grade I diastolic dysfunction (impaired relaxation). Elevated left atrial pressure.  3. Right ventricular systolic function is normal. The right ventricular size is normal.  4. The mitral valve is normal in structure. No evidence of mitral valve regurgitation. No evidence of mitral stenosis.  5. The aortic valve is normal in structure. Aortic valve  regurgitation is not visualized. No aortic stenosis is present.  6. The inferior vena cava is normal in size with greater than 50% respiratory variability, suggesting right atrial pressure of 3 mmHg. FINDINGS  Left Ventricle: Left ventricular ejection fraction, by estimation, is 60 to 65%. The left ventricle has normal function. The left ventricle has no regional wall motion abnormalities. The left ventricular internal cavity size was normal in size. There is  moderate left ventricular hypertrophy. Left ventricular diastolic parameters are consistent with Grade I diastolic dysfunction (impaired relaxation). Elevated left atrial pressure. Right Ventricle: The right ventricular size is normal.Right ventricular systolic function is normal. Left Atrium: Left atrial size was normal in size. Right Atrium: Right atrial size was normal in size. Pericardium: There is no evidence of pericardial effusion. Mitral Valve: The mitral valve is normal in structure. Normal mobility of the mitral valve leaflets. Mild mitral annular calcification. No evidence of mitral valve regurgitation. No evidence of mitral valve stenosis. Tricuspid Valve: The tricuspid valve is normal in structure. Tricuspid valve regurgitation is trivial. No evidence of tricuspid stenosis. Aortic Valve: The aortic valve is normal in structure. Aortic valve regurgitation is not visualized. No aortic stenosis is present. Aortic valve mean gradient measures 9.0 mmHg. Aortic valve peak gradient measures 12.8 mmHg. Aortic valve area, by VTI measures 1.53 cm. Pulmonic Valve: The pulmonic valve was not well visualized. Pulmonic valve regurgitation is not visualized. No evidence of pulmonic stenosis. Aorta: The aortic root is normal in size and structure. Venous: The inferior vena cava is normal in size with greater than 50% respiratory variability, suggesting right atrial pressure of 3 mmHg.  Additional Comments: Normal LV systolic function; grade 1 diastolic  dysfunction.  LEFT VENTRICLE PLAX 2D LVIDd:         3.60 cm  Diastology LVIDs:         2.50 cm  LV e' lateral:   6.22 cm/s LV PW:         1.40 cm  LV E/e' lateral: 21.4 LV IVS:        1.50 cm  LV e' medial:    7.84 cm/s LVOT diam:     1.90 cm  LV E/e' medial:  17.0 LV SV:         73 LV SV Index:   33 LVOT Area:     2.84 cm  RIGHT VENTRICLE RV Basal diam:  3.10 cm RV S prime:     8.70 cm/s TAPSE (M-mode): 2.4 cm LEFT ATRIUM  Index       RIGHT ATRIUM           Index LA diam:      3.20 cm 1.46 cm/m  RA Area:     19.40 cm LA Vol (A2C): 56.5 ml 25.83 ml/m RA Volume:   61.40 ml  28.07 ml/m LA Vol (A4C): 61.0 ml 27.88 ml/m  AORTIC VALVE AV Area (Vmax):    1.77 cm AV Area (Vmean):   1.43 cm AV Area (VTI):     1.53 cm AV Vmax:           179.00 cm/s AV Vmean:          141.000 cm/s AV VTI:            0.476 m AV Peak Grad:      12.8 mmHg AV Mean Grad:      9.0 mmHg LVOT Vmax:         112.00 cm/s LVOT Vmean:        71.200 cm/s LVOT VTI:          0.257 m LVOT/AV VTI ratio: 0.54  AORTA Ao Root diam: 3.10 cm MITRAL VALVE MV Area (PHT): 3.17 cm     SHUNTS MV Decel Time: 239 msec     Systemic VTI:  0.26 m MV E velocity: 133.00 cm/s  Systemic Diam: 1.90 cm MV A velocity: 139.00 cm/s MV E/A ratio:  0.96 Olga Millers MD Electronically signed by Olga Millers MD Signature Date/Time: 03/18/2020/3:50:18 PM    Final    US Abdomen Limited RUQ  Result Date: 03/18/2020 CLINICAL DATA:  Abnormal liver function tests EXAM: ULTRASOUND ABDOMEN LIMITED RIGHT UPPER QUADRANT COMPARISON:  None. FINDINGS: Gallbladder: Gallbladder is surgically absent Common bile duct: Diameter: 6 mm Liver: Coarsened hepatic echotexture with mildly nodular contour. The liver parenchyma is echogenic. Portal vein is patent on color Doppler imaging with normal direction of blood flow towards the liver. Other: None. IMPRESSION: Hepatic steatosis and possible cirrhosis. Surgically absent gallbladder without biliary dilatation. Electronically Signed    By: Deatra Robinson M.D.   On: 03/18/2020 03:01     Not all labs, radiology exams or other studies done during hospitalization come through on my EPIC note; however they are reviewed by me.    Assessment and Plan  Intractable low back pain secondary to DDD and lumbar spinal stenosis-CT T/L-spine notable for general osteopenia, severe multilevel degenerative disc disease with bulky osteophytes, multilevel moderate/severe lumbar spinal stenosis SNF-admitted for OT/PT; continue pain control with tramadol 50 mg twice daily  Bilateral pulmonary infiltrates on chest x-ray/multiple pulmonary nodules-recent COVID-19 infection January 2020; infiltrates felt to be secondary to prior Covid infection, follow-up CT in 3 months for follow-up lung nodules  Bilateral lower extremity lymphedema/acute on chronic CKD stage III AAA -Unna boots placed; status post IV Lasix diuresis with creatinine rising to 2.0 at the time of discharge SNF-continue boot; continue Lasix 40 mg daily; follow-up BMP  Hypertension SNF-controlled; continue Norvasc 5 mg daily, Coreg 12.5 mg nightly, Lasix 40 mg daily, and lisinopril 40 mg nightly  Hypothyroidism-TSH 5.881, free T4 1.35 SNF-continue levothyroxine 150 mcg p.o. daily  GERD SNF-not stated as uncontrolled; continue omeprazole 20 mg daily  Pressure injury right buttocks SNF-followed by wound care nurse   Time spent greater than 45 minutes;> 50% of time with patient was spent reviewing records, labs, tests and studies, counseling and developing plan of care  Margit Hanks, MD

## 2020-03-22 DIAGNOSIS — N189 Chronic kidney disease, unspecified: Secondary | ICD-10-CM | POA: Insufficient documentation

## 2020-03-22 DIAGNOSIS — R918 Other nonspecific abnormal finding of lung field: Secondary | ICD-10-CM | POA: Insufficient documentation

## 2020-03-22 DIAGNOSIS — M48061 Spinal stenosis, lumbar region without neurogenic claudication: Secondary | ICD-10-CM | POA: Insufficient documentation

## 2020-03-22 DIAGNOSIS — N179 Acute kidney failure, unspecified: Secondary | ICD-10-CM | POA: Insufficient documentation

## 2020-03-25 ENCOUNTER — Non-Acute Institutional Stay (SKILLED_NURSING_FACILITY): Payer: Medicare HMO | Admitting: Internal Medicine

## 2020-03-25 ENCOUNTER — Encounter: Payer: Self-pay | Admitting: Internal Medicine

## 2020-03-25 DIAGNOSIS — M545 Low back pain, unspecified: Secondary | ICD-10-CM

## 2020-03-25 DIAGNOSIS — N189 Chronic kidney disease, unspecified: Secondary | ICD-10-CM

## 2020-03-25 DIAGNOSIS — I10 Essential (primary) hypertension: Secondary | ICD-10-CM

## 2020-03-25 DIAGNOSIS — N179 Acute kidney failure, unspecified: Secondary | ICD-10-CM

## 2020-03-25 DIAGNOSIS — G8929 Other chronic pain: Secondary | ICD-10-CM

## 2020-03-25 LAB — BASIC METABOLIC PANEL
BUN: 34 — AB (ref 4–21)
CO2: 27 — AB (ref 13–22)
Chloride: 95 — AB (ref 99–108)
Creatinine: 1.4 — AB (ref 0.5–1.1)
Glucose: 88
Potassium: 5.4 — AB (ref 3.4–5.3)
Sodium: 138 (ref 137–147)

## 2020-03-25 LAB — COMPREHENSIVE METABOLIC PANEL
Calcium: 9.2 (ref 8.7–10.7)
GFR calc Af Amer: 41.16
GFR calc non Af Amer: 35.51

## 2020-03-25 LAB — CBC AND DIFFERENTIAL
HCT: 38 (ref 36–46)
Hemoglobin: 12.2 (ref 12.0–16.0)
Platelets: 178 (ref 150–399)
WBC: 8.3

## 2020-03-25 LAB — CBC: RBC: 3.95 (ref 3.87–5.11)

## 2020-03-25 NOTE — Progress Notes (Signed)
Location:    [facility] Room Number: 967/E Place of Service:  SNF [phone]) Provider:  Gifford Morris., MD  Patient Care Team: Brandy Morris., MD as PCP - General (Internal Medicine)  Extended Emergency Contact Information Primary Emergency Contact: Brandy Morris Address: [address] [place], [place] [address] Brandy Morris of [place] Phone: [phone] Relation: Spouse Secondary Emergency Contact: Brandy Morris Phone: [phone] Mobile Phone: [phone] Relation: Daughter  Code Status:  Full Code Goals of care: Advanced Directive information Advanced Directives 03/25/2020  Does Patient Have a Medical Advance Directive? Yes  Type of Advance Directive (No Data)  Does patient want to make changes to medical advance directive? No - Patient declined  Would patient like information on creating a medical advance directive? -     Chief Complaint  Patient presents with  . Acute Visit    Pain Management    HPI:  Pt is a 85 y.o. female seen today for an acute visit for pain management  She was recently admitted to this facility for rehab. She did have a history of intractable low back pain secondary to degenerative disc disease and lumbar spinal stenosis-she has orders for tramadol 50 mg twice daily-per nursing and family this is not totally effective-and would like the tramadol increased.  Apparently she has been tolerating the tramadol well.  It appears she also has a history of chronic kidney disease thought to be stage III-creatinine did rise in the hospital after she was given IV Lasix secondary to lower extremity edema and lymphedema.  Creatinine actually was 2.07 on discharge and potassium was 5.8 and update lab is pending.  Currently she is sitting in her chair comfortably but apparently with significant exercise working with therapy she has significant pain.  Staff has noted rash of her lower back  and sacral area  Vital signs appear to be stable  .     Past Medical History:  Diagnosis Date  . Diabetes mellitus without complication ([place])   . GERD (gastroesophageal reflux disease)   . History of COVID-19 03/18/2020  . Hypertension    Past Surgical History:  Procedure Laterality Date  . ABDOMINAL HYSTERECTOMY    . Bladder Tack    . CHOLECYSTECTOMY    . JOINT REPLACEMENT    . THYROIDECTOMY    . TUBAL LIGATION      Allergies  Allergen Reactions  . Ciprofloxacin Hives  . Codeine     Don't like the way it makes me feel.  . Latex Itching, Rash and Other (See Comments)  . Povidone-Iodine Rash    Other reaction(s): Other (See Comments) unknown     Outpatient Encounter Medications as of 03/25/2020  Medication Sig  . albuterol (VENTOLIN HFA) 108 (90 Base) MCG/ACT inhaler Inhale 2 puffs into the lungs every 6 (six) hours as needed for wheezing or shortness of breath.  Marland Kitchen amLODipine (NORVASC) 5 MG tablet Take 5 mg by mouth every evening.   . carvedilol (COREG) 6.25 MG tablet Take 12.5 mg by mouth at bedtime.  . furosemide (LASIX) 40 MG tablet Take 40 mg by mouth daily.  Marland Kitchen glimepiride (AMARYL) 1 MG tablet Take 1 mg by mouth daily.  Marland Kitchen levothyroxine (SYNTHROID) 150 MCG tablet Take 150 mcg by mouth every evening.  Marland Kitchen lisinopril (ZESTRIL) 40 MG tablet Take 40 mg by mouth every evening. For Essential HTN  . NON FORMULARY DIET :  Regular NAS, CCD, Heart healthy  . nystatin (MYCOSTATIN/NYSTOP) powder Apply 1 application topically 3 (three) times daily as needed for rash. Apply to groin area tid prn for yeast in groin area.  Marland Kitchen omeprazole (PRILOSEC) 20 MG capsule Take 20 mg by mouth daily.  . traMADol (ULTRAM) 50 MG tablet Take 1 tablet (50 mg total) by mouth 2 (two) times daily.  Marland Kitchen triamcinolone cream (KENALOG) 0.1 % Apply 1 application topically daily as needed (rash).   . [DISCONTINUED] Glucose Blood (BLOOD GLUCOSE TEST STRIPS) STRP 1 strip by Other route 3 (three) times a week.    No facility-administered encounter medications on file as of 03/25/2020.    Review of Systems   In general she not complaining of any fever or chills.  Skin-does have a rash as noted above  Head ears eyes nose mouth and throat does not complain of visual changes or sore throat.  Respiratory does not complain of shortness of breath or cough.  Cardiac is not complaining of chest pain he has chronic lower extremity lymphedema.  GI is not complaining of abdominal pain nausea vomiting diarrhea constipation.  GU no complaints of dysuria.  Musculoskeletal does have a history of back pain as noted above.  Neurologic does not complain of dizziness headache or numbness at this time.  And psych does not complain of being depressed or anxious.    Immunization History  Administered Date(s) Administered  . Influenza, High Dose Seasonal PF 09/21/2016, 08/12/2017, 09/26/2017, 01/26/2019, 08/17/2019  . Influenza-Unspecified 08/20/2015  . Pneumococcal Conjugate-13 03/08/2014  . Pneumococcal Polysaccharide-23 09/11/2012  . Td 08/14/2007  . Zoster 09/05/2013   Pertinent  Health Maintenance Due  Topic Date Due  . FOOT EXAM  Never done  . OPHTHALMOLOGY EXAM  Never done  . DEXA SCAN  Never done  . INFLUENZA VACCINE  06/22/2020  . HEMOGLOBIN A1C  09/17/2020  . PNA vac Low Risk Adult  Completed   No flowsheet data found. Functional Status Survey:    Vitals:   03/25/20 1318  BP: 110/65  Pulse: 70  Resp: 18  Temp: 98 F (36.7 C)  TempSrc: Oral  SpO2: 97%  Weight: 246 lb (111.6 kg)  Height:  (1.6 m)   Body mass index is 43.58 kg/m. Physical Exam In general this is a pleasant elderly female in no distress.  Her skin is warm and dry.-Rash was difficult to assess secondary to patient positioning I could see the borders of it lower back-Per staff this is erythematous and appears to be yeast.    Eyes visual acuity appears to be intact sclera and conjunctive are  clear.  Oropharynx is clear mucous membranes moist.  Chest is largely clear to auscultation there is no labored breathing there is a very minimal expiratory wheeze low bases.  Heart is regular rate and rhythm without murmur gallop or rub she does have wrapping of her lower extremities with history of chronic lymphedema.  Abdomen is soft nontender with positive bowel sounds.  Musculoskeletal Limited exam since he is sitting in her chair but appears able to move all extremities x4 with some lower extremity weakness secondary to lymphedema-there is some tenderness palpation of the back.  Neurologic appears grossly intact cannot appreciate lateralizing findings her speech is clear.  Psych she is pleasant and appropriate Labs reviewed: Recent Labs    11/29/19 0250 11/29/19 0250 11/30/19 0251 11/30/19 0251 12/01/19 0307 01/15/20 1610 03/18/20 0457 03/19/20 0403 03/20/20 0847  NA 137   < > 138   < >  141   < > 141 137 130*  K 4.6   < > 5.1   < > 5.4*   < > 5.0 4.8 5.8*  CL 100   < > 101   < > 103   < > 100 97* 92*  CO2 27   < > 27   < > 30   < > 33* 31 30  GLUCOSE 154*   < > 139*   < > 119*   < > 102* 90 91  BUN 23   < > 33*   < > 39*   < > 28* 31* 39*  CREATININE 1.20*   < > 1.13*   < > 1.15*   < > 1.41* 1.63* 2.07*  CALCIUM 8.3*   < > 8.3*   < > 8.8*   < > 9.2 8.4* 8.0*  MG 2.3   < > 2.2   < > 2.4  --  2.3 2.0  --   PHOS 2.6  --  3.7  --  3.6  --   --   --   --    < > = values in this interval not displayed.   Recent Labs    01/15/20 1610 03/17/20 2331 03/18/20 0457  AST 51* 36 37  ALT 40 23 24  ALKPHOS 296* 304* 304*  BILITOT 0.7 0.8 0.9  PROT 7.3 6.9 6.9  ALBUMIN 3.0* 3.1* 2.9*   Recent Labs    12/01/19 0307 01/15/20 1610 03/17/20 2331 03/18/20 0457 03/19/20 0403  WBC 11.7*   < > 11.2* 10.4 10.4  NEUTROABS 8.6*  --  6.7 6.4  --   HGB 13.3   < > 12.0 12.0 11.0*  HCT 43.3   < > 38.7 38.4 35.7*  MCV 97.5   < > 99.0 99.0 99.4  PLT 236   < > 244 229 213   < > =  values in this interval not displayed.   Lab Results  Component Value Date   TSH 5.881 (H) 03/18/2020   Lab Results  Component Value Date   HGBA1C 5.8 (H) 03/18/2020   Lab Results  Component Value Date   TRIG 67 11/27/2019    Significant Diagnostic Results in last 30 days:  CT CHEST WO CONTRAST  Result Date: 03/18/2020 CLINICAL DATA:  Pain from the left shoulder bleed to the left hip for 2 weeks. EXAM: CT CHEST WITHOUT CONTRAST TECHNIQUE: Multidetector CT imaging of the chest was performed following the standard protocol without IV contrast. COMPARISON:  November 27, 2019 FINDINGS: Cardiovascular: There is moderate severity calcification of the aortic arch. Normal heart size. No pericardial effusion. Marked severity coronary artery calcification is seen. Mediastinum/Nodes: No enlarged mediastinal or axillary lymph nodes. Lungs/Pleura: A new 5 mm noncalcified lung nodule is seen within the anterolateral aspect of the right upper lobe (axial CT image 55, CT series number 5). 6 mm and 7 mm noncalcified lung nodule seen within the posterior aspect of the left apex. This represents a new finding when compared to the prior exam. A stable 6 mm noncalcified lung nodule is seen within the posterior aspect of the left lower lobe (axial CT image 70, CT series number 5). A 6 mm noncalcified lung nodule is seen within the anterolateral aspect of the left lower lobe (axial CT image 82, CT series number 5). This represents a new finding when compared to the prior study. Stable 4 mm and 5 mm noncalcified lung nodules are seen within the  right middle lobe. A small patchy area of atelectasis and/or early infiltrate is seen within the posteromedial aspect of the left lower lobe. There is no evidence of a pleural effusion or pneumothorax. Upper Abdomen: There is a moderate-sized hiatal hernia. Musculoskeletal: Multilevel degenerative changes seen throughout the thoracic spine. IMPRESSION: 1. Multiple new subcentimeter  bilateral noncalcified lung nodules. Correlation with 3 month follow-up chest CT is recommended to determine stability. 2. Small patchy area of atelectasis and/or early infiltrate within the posteromedial aspect of the left lower lobe. 3. Moderate-sized hiatal hernia. Aortic Atherosclerosis (ICD10-I70.0). Electronically Signed   By: Aram Candela M.D.   On: 03/18/2020 04:06   CT THORACIC SPINE WO CONTRAST  Result Date: 03/18/2020 CLINICAL DATA:  Back pain EXAM: CT THORACIC AND LUMBAR SPINE WITHOUT CONTRAST TECHNIQUE: Multidetector CT imaging of the thoracic and lumbar spine was performed without contrast. Multiplanar CT image reconstructions were also generated. COMPARISON:  CTA chest 11/27/2019, CT abdomen pelvis 08/08/2019 FINDINGS: CT THORACIC SPINE FINDINGS Alignment: Mild right convex asymmetry Vertebrae: Bones are diffusely osteopenic. At the T11 and T12 levels, there is lucency of the anterior vertebral bodies. Paraspinal and other soft tissues: Please see report for dedicated CT of the chest. Disc levels: There is no bony spinal canal stenosis. CT LUMBAR SPINE FINDINGS Segmentation: 5 lumbar type vertebrae. Alignment: Grade 1 retrolisthesis at L3-4 and grade 1 anterolisthesis at L4-5. Mild left convex asymmetry. Vertebrae: Osteopenia. Bulky osteophytes at L1-2, L2-3 and L3-4. There are large areas of lucency within the posterior aspect of the L4 and L5 vertebrae. Paraspinal and other soft tissues: Calcific aortic atherosclerosis. Disc levels: L1-2: Partially calcified disc material and endplate spurring with mild spinal canal stenosis. L2-3: Endplate spurring with mild spinal canal stenosis. L3-4: Severe facet hypertrophy. Disc bulge with endplate spurring. Severe spinal canal stenosis. L4-5: Severe facet hypertrophy with disc bulge and endplate spurring. Moderate spinal canal stenosis. L5-S1: No bony spinal canal stenosis. IMPRESSION: 1. Generalized osteopenia with multiple areas of somewhat focal  demineralization. This appearance is exaggerated by photon starvation artifacts due to patient body habitus. Within these limitations, no acute fracture. 2. Severe multilevel degenerative disc disease with bulky osteophytosis causing fusion of long segments of the lower thoracic and lumbar spine. 3. Multilevel moderate-to-severe lumbar spinal canal stenosis. 4.  Aortic atherosclerosis (ICD10-I70.0). Electronically Signed   By: Deatra Robinson M.D.   On: 03/18/2020 04:04   CT LUMBAR SPINE WO CONTRAST  Result Date: 03/18/2020 CLINICAL DATA:  Back pain EXAM: CT THORACIC AND LUMBAR SPINE WITHOUT CONTRAST TECHNIQUE: Multidetector CT imaging of the thoracic and lumbar spine was performed without contrast. Multiplanar CT image reconstructions were also generated. COMPARISON:  CTA chest 11/27/2019, CT abdomen pelvis 08/08/2019 FINDINGS: CT THORACIC SPINE FINDINGS Alignment: Mild right convex asymmetry Vertebrae: Bones are diffusely osteopenic. At the T11 and T12 levels, there is lucency of the anterior vertebral bodies. Paraspinal and other soft tissues: Please see report for dedicated CT of the chest. Disc levels: There is no bony spinal canal stenosis. CT LUMBAR SPINE FINDINGS Segmentation: 5 lumbar type vertebrae. Alignment: Grade 1 retrolisthesis at L3-4 and grade 1 anterolisthesis at L4-5. Mild left convex asymmetry. Vertebrae: Osteopenia. Bulky osteophytes at L1-2, L2-3 and L3-4. There are large areas of lucency within the posterior aspect of the L4 and L5 vertebrae. Paraspinal and other soft tissues: Calcific aortic atherosclerosis. Disc levels: L1-2: Partially calcified disc material and endplate spurring with mild spinal canal stenosis. L2-3: Endplate spurring with mild spinal canal stenosis. L3-4: Severe facet hypertrophy.  Disc bulge with endplate spurring. Severe spinal canal stenosis. L4-5: Severe facet hypertrophy with disc bulge and endplate spurring. Moderate spinal canal stenosis. L5-S1: No bony spinal  canal stenosis. IMPRESSION: 1. Generalized osteopenia with multiple areas of somewhat focal demineralization. This appearance is exaggerated by photon starvation artifacts due to patient body habitus. Within these limitations, no acute fracture. 2. Severe multilevel degenerative disc disease with bulky osteophytosis causing fusion of long segments of the lower thoracic and lumbar spine. 3. Multilevel moderate-to-severe lumbar spinal canal stenosis. 4.  Aortic atherosclerosis (ICD10-I70.0). Electronically Signed   By: Deatra Robinson M.D.   On: 03/18/2020 04:04   DG Chest Portable 1 View  Result Date: 03/17/2020 CLINICAL DATA:  Peripheral edema EXAM: PORTABLE CHEST 1 VIEW COMPARISON:  January 15, 2020 FINDINGS: The heart size and mediastinal contours are within normal limits. Mildly increased interstitial markings are seen throughout both lungs. There is also slightly patchy airspace opacity seen within the right upper lobe. Aortic knob calcifications are seen. No pleural effusion. No acute osseous abnormality. IMPRESSION: Interstitial/patchy airspace opacity within both lungs which could be due to edema and/or infectious etiology. Electronically Signed   By: Jonna Clark M.D.   On: 03/17/2020 23:20   ECHOCARDIOGRAM COMPLETE  Result Date: 03/18/2020    ECHOCARDIOGRAM REPORT   Patient Name:   KENNY STERN Broden Date of Exam: 03/18/2020 Medical Rec #:  [id number]        Height:       62.0 in Accession #:    [id number]       Weight:       274.7 lb Date of Birth:  1934-12-31       BSA:          2.188 m Patient Age:    84 years         BP:           138/48 mmHg Patient Gender: F                HR:           69 bpm. Exam Location:  Inpatient Procedure: 2D Echo, Cardiac Doppler and Color Doppler Indications:    CHF  History:        Patient has no prior history of Echocardiogram examinations. LE                 edema; Risk Factors:Hypertension, Morbid obesity and Diabetes.                 Recent COVID,.  Sonographer:     Lavenia Atlas Referring Phys: [id number] Marinda Elk  Sonographer Comments: Patient is morbidly obese. IMPRESSIONS  1. Normal LV systolic function; grade 1 diastolic dysfunction.  2. Left ventricular ejection fraction, by estimation, is 60 to 65%. The left ventricle has normal function. The left ventricle has no regional wall motion abnormalities. There is moderate left ventricular hypertrophy. Left ventricular diastolic parameters are consistent with Grade I diastolic dysfunction (impaired relaxation). Elevated left atrial pressure.  3. Right ventricular systolic function is normal. The right ventricular size is normal.  4. The mitral valve is normal in structure. No evidence of mitral valve regurgitation. No evidence of mitral stenosis.  5. The aortic valve is normal in structure. Aortic valve regurgitation is not visualized. No aortic stenosis is present.  6. The inferior vena cava is normal in size with greater than 50% respiratory variability, suggesting right atrial pressure of 3 mmHg. FINDINGS  Left Ventricle: Left ventricular ejection  fraction, by estimation, is 60 to 65%. The left ventricle has normal function. The left ventricle has no regional wall motion abnormalities. The left ventricular internal cavity size was normal in size. There is  moderate left ventricular hypertrophy. Left ventricular diastolic parameters are consistent with Grade I diastolic dysfunction (impaired relaxation). Elevated left atrial pressure. Right Ventricle: The right ventricular size is normal.Right ventricular systolic function is normal. Left Atrium: Left atrial size was normal in size. Right Atrium: Right atrial size was normal in size. Pericardium: There is no evidence of pericardial effusion. Mitral Valve: The mitral valve is normal in structure. Normal mobility of the mitral valve leaflets. Mild mitral annular calcification. No evidence of mitral valve regurgitation. No evidence of mitral valve stenosis. Tricuspid  Valve: The tricuspid valve is normal in structure. Tricuspid valve regurgitation is trivial. No evidence of tricuspid stenosis. Aortic Valve: The aortic valve is normal in structure. Aortic valve regurgitation is not visualized. No aortic stenosis is present. Aortic valve mean gradient measures 9.0 mmHg. Aortic valve peak gradient measures 12.8 mmHg. Aortic valve area, by VTI measures 1.53 cm. Pulmonic Valve: The pulmonic valve was not well visualized. Pulmonic valve regurgitation is not visualized. No evidence of pulmonic stenosis. Aorta: The aortic root is normal in size and structure. Venous: The inferior vena cava is normal in size with greater than 50% respiratory variability, suggesting right atrial pressure of 3 mmHg.  Additional Comments: Normal LV systolic function; grade 1 diastolic dysfunction.  LEFT VENTRICLE PLAX 2D LVIDd:         3.60 cm  Diastology LVIDs:         2.50 cm  LV e' lateral:   6.22 cm/s LV PW:         1.40 cm  LV E/e' lateral: 21.4 LV IVS:        1.50 cm  LV e' medial:    7.84 cm/s LVOT diam:     1.90 cm  LV E/e' medial:  17.0 LV SV:         73 LV SV Index:   33 LVOT Area:     2.84 cm  RIGHT VENTRICLE RV Basal diam:  3.10 cm RV S prime:     8.70 cm/s TAPSE (M-mode): 2.4 cm LEFT ATRIUM           Index       RIGHT ATRIUM           Index LA diam:      3.20 cm 1.46 cm/m  RA Area:     19.40 cm LA Vol (A2C): 56.5 ml 25.83 ml/m RA Volume:   61.40 ml  28.07 ml/m LA Vol (A4C): 61.0 ml 27.88 ml/m  AORTIC VALVE AV Area (Vmax):    1.77 cm AV Area (Vmean):   1.43 cm AV Area (VTI):     1.53 cm AV Vmax:           179.00 cm/s AV Vmean:          141.000 cm/s AV VTI:            0.476 m AV Peak Grad:      12.8 mmHg AV Mean Grad:      9.0 mmHg LVOT Vmax:         112.00 cm/s LVOT Vmean:        71.200 cm/s LVOT VTI:          0.257 m LVOT/AV VTI ratio: 0.54  AORTA Ao Root diam: 3.10 cm MITRAL VALVE  MV Area (PHT): 3.17 cm     SHUNTS MV Decel Time: 239 msec     Systemic VTI:  0.26 m MV E velocity:  133.00 cm/s  Systemic Diam: 1.90 cm MV A velocity: 139.00 cm/s MV E/A ratio:  0.96 Olga Millers MD Electronically signed by Olga Millers MD Signature Date/Time: 03/18/2020/3:50:18 PM    Final    US Abdomen Limited RUQ  Result Date: 03/18/2020 CLINICAL DATA:  Abnormal liver function tests EXAM: ULTRASOUND ABDOMEN LIMITED RIGHT UPPER QUADRANT COMPARISON:  None. FINDINGS: Gallbladder: Gallbladder is surgically absent Common bile duct: Diameter: 6 mm Liver: Coarsened hepatic echotexture with mildly nodular contour. The liver parenchyma is echogenic. Portal vein is patent on color Doppler imaging with normal direction of blood flow towards the liver. Other: None. IMPRESSION: Hepatic steatosis and possible cirrhosis. Surgically absent gallbladder without biliary dilatation. Electronically Signed   By: Deatra Robinson M.D.   On: 03/18/2020 03:01    Assessment/Plan #1 history of low back pain with history of degenerative disc disease as well as stenosis-will increase tramadol to 50 mg 3 times daily hold for sedation for respiratory changes-we may have to increase the actual dose of tramadol-nursing will be paying close attention of this-.  2.  History of lymphedema she did receive Lasix in the hospital this appears to be stable but will need to be watched her legs are currently wrapped.  3.  History of chronic kidney disease creatinine did rise up to over 2 potassium was actually noted to be 5.8-update lab has just arrived today and does show improvement with a creatinine of 1.38 potassium is down to 5.4.  We will discontinue the lisinopril since that may add to the mild elevation in potassium.  Lab will need to be rechecked early next week.  4.  History of hypertension she is on Norvasc 5 mg a day Coreg 12.5 mg a day as well as Lasix 40 mg a day.  We will discontinue lisinopril and monitor blood pressure appears to be stable recent blood pressures 110/65-105/56.  5.  History of rash-will do a course  of Diflucan and monitor.  [id number]

## 2020-03-31 ENCOUNTER — Encounter: Payer: Self-pay | Admitting: Internal Medicine

## 2020-03-31 LAB — BASIC METABOLIC PANEL
BUN: 36 — AB (ref 4–21)
CO2: 32 — AB (ref 13–22)
Chloride: 94 — AB (ref 99–108)
Creatinine: 1.5 — AB (ref 0.5–1.1)
Glucose: 72
Potassium: 5.3 (ref 3.4–5.3)
Sodium: 135 — AB (ref 137–147)

## 2020-03-31 LAB — COMPREHENSIVE METABOLIC PANEL
Calcium: 9.3 (ref 8.7–10.7)
GFR calc Af Amer: 37.2
GFR calc non Af Amer: 32.1

## 2020-04-02 ENCOUNTER — Non-Acute Institutional Stay (SKILLED_NURSING_FACILITY): Payer: Medicare HMO | Admitting: Internal Medicine

## 2020-04-02 ENCOUNTER — Encounter: Payer: Self-pay | Admitting: Internal Medicine

## 2020-04-02 DIAGNOSIS — N1832 Chronic kidney disease, stage 3b: Secondary | ICD-10-CM

## 2020-04-02 DIAGNOSIS — E1122 Type 2 diabetes mellitus with diabetic chronic kidney disease: Secondary | ICD-10-CM

## 2020-04-02 DIAGNOSIS — M48061 Spinal stenosis, lumbar region without neurogenic claudication: Secondary | ICD-10-CM

## 2020-04-02 DIAGNOSIS — I10 Essential (primary) hypertension: Secondary | ICD-10-CM

## 2020-04-02 DIAGNOSIS — E1121 Type 2 diabetes mellitus with diabetic nephropathy: Secondary | ICD-10-CM

## 2020-04-02 DIAGNOSIS — R918 Other nonspecific abnormal finding of lung field: Secondary | ICD-10-CM | POA: Diagnosis not present

## 2020-04-02 DIAGNOSIS — I890 Lymphedema, not elsewhere classified: Secondary | ICD-10-CM

## 2020-04-02 NOTE — Progress Notes (Signed)
Location:    [facility] Room Number: 513/P Place of Service:  SNF [phone])  Provider: Estill Batten  PCP: [name], MD Patient Care Team: [name], MD as PCP - General (Internal Medicine)  Extended Emergency Contact Information Primary Emergency Contact: [name] Address: [address]          [place], [place] [address] [name] Home Phone: [phone] Relation: Spouse Secondary Emergency Contact: Anderson,Sharon Home Phone: [phone] Mobile Phone: [phone] Relation: Daughter  Code Status: Full Code Goals of care:  Advanced Directive information Advanced Directives 04/02/2020  Does Patient Have a Medical Advance Directive? Yes  Type of Advance Directive (No Data)  Does patient want to make changes to medical advance directive? No - Patient declined  Would patient like information on creating a medical advance directive? -     Allergies  Allergen Reactions  . Ciprofloxacin Hives  . Codeine     Don't like the way it makes me feel.  . Latex Itching, Rash and Other (See Comments)  . Povidone-Iodine Rash    Other reaction(s): Other (See Comments) unknown     Chief Complaint  Patient presents with  . Discharge Note    Discharge Visit    HPI:  85 y.o. female seen today for discharge from facility which is scheduled for Friday, Apr 04, 2020.  She has been here for short-term rehab after hospitalization for acute low back pain.  It was thought this was due to degenerative disc disease and lumbar spinal stenosis-she was admitted here for therapy strengthening and pain control.  Currently she is on tramadol 100 mg twice daily 50 mg at lunchtime and apparently this is helping-she will need continued PT and OT as well as a nursing aide at home.  She also has a history of bilateral lower extremity edema lymphedema-complicated with chronic kidney disease-she did receive IV Lasix and diuresis in the  hospital and her creatinine did go up to around  2.0.  It has moderated  creatinine was 1.5 on lab done on May 7-this will need follow-up with primary care provider.-She also appears to have a mildly elevated potassium which appears to be somewhat chronic most recent lab it was 5.3-which was down from 5.8 on hospital discharge-this also will warrant follow-up-  She is on Lasix 40 mg a day.  She also was found to have bilateral pulmonary infiltrates on chest x-ray-with multiple pulmonary nodules-she did have a recent COVID-19 infection in January-infiltrates were thought likely secondary to the infection-there is suggestion for a follow-up CT scan in about 3 months for follow-up of the lung nodules.  Her other medical issues appear to be stable.  She does have a history of hypertension which appears to be stable she is on Norvasc 5 mg a day as well as Coreg 12.5 mg a day her lisinopril has been discontinued.  She also has had a rash-- more of her sacral area-- this hasbeen followed by wound care and has responded to topical treatment.  Per nursing-she also received a short course of Diflucan  She does have a history of type 2 diabetes she is on Amaryl 1 mg a day-per nursing blood sugars are stable morning sugars appear to be more in the 80s to lower 100s-Per nursing generally in the low 100s there are not significant elevations later in the day that are consistent  Currently she is sitting in her chair comfortably she is looking forward to  going home-she will be with her husband-she will need a nursing aide as well as PT and OT as well as a wheelchair secondary to her limited mobility status and fall risk.      Past Medical History:  Diagnosis Date  . Diabetes mellitus without complication (HCC)   . GERD (gastroesophageal reflux disease)   . History of COVID-19 03/18/2020  . Hypertension     Past Surgical History:  Procedure Laterality Date  . ABDOMINAL HYSTERECTOMY    . Bladder Tack     . CHOLECYSTECTOMY    . JOINT REPLACEMENT    . THYROIDECTOMY    . TUBAL LIGATION        reports that she has never smoked. She has never used smokeless tobacco. She reports that she does not drink alcohol or use drugs. Social History   Socioeconomic History  . Marital status: Married    Spouse name: Not on file  . Number of children: Not on file  . Years of education: Not on file  . Highest education level: Not on file  Occupational History  . Not on file  Tobacco Use  . Smoking status: Never Smoker  . Smokeless tobacco: Never Used  Substance and Sexual Activity  . Alcohol use: No  . Drug use: No  . Sexual activity: Not on file  Other Topics Concern  . Not on file  Social History Narrative  . Not on file   Social Determinants of Health   Financial Resource Strain:   . Difficulty of Paying Living Expenses:   Food Insecurity:   . Worried About Programme researcher, broadcasting/film/video in the Last Year:   . Barista in the Last Year:   Transportation Needs:   . Freight forwarder (Medical):   Marland Kitchen Lack of Transportation (Non-Medical):   Physical Activity:   . Days of Exercise per Week:   . Minutes of Exercise per Session:   Stress:   . Feeling of Stress :   Social Connections:   . Frequency of Communication with Friends and Family:   . Frequency of Social Gatherings with Friends and Family:   . Attends Religious Services:   . Active Member of Clubs or Organizations:   . Attends Banker Meetings:   Marland Kitchen Marital Status:   Intimate Partner Violence:   . Fear of Current or Ex-Partner:   . Emotionally Abused:   Marland Kitchen Physically Abused:   . Sexually Abused:    Functional Status Survey:    Allergies  Allergen Reactions  . Ciprofloxacin Hives  . Codeine     Don't like the way it makes me feel.  . Latex Itching, Rash and Other (See Comments)  . Povidone-Iodine Rash    Other reaction(s): Other (See Comments) unknown     Pertinent  Health Maintenance Due  Topic  Date Due  . FOOT EXAM  Never done  . OPHTHALMOLOGY EXAM  Never done  . DEXA SCAN  Never done  . INFLUENZA VACCINE  06/22/2020  . HEMOGLOBIN A1C  09/17/2020  . PNA vac Low Risk Adult  Completed    Medications: Allergies as of 04/02/2020      Reactions   Ciprofloxacin Hives   Codeine    Don't like the way it makes me feel.   Latex Itching, Rash, Other (See Comments)   Povidone-iodine Rash   Other reaction(s): Other (See Comments) unknown      Medication List       Accurate as  of Apr 02, 2020  3:28 PM. If you have any questions, ask your nurse or doctor.        STOP taking these medications   lisinopril 40 MG tablet Commonly known as: ZESTRIL Stopped by: Edmon CrapeArlo Gaspar Fowle, PA-C     TAKE these medications   albuterol 108 (90 Base) MCG/ACT inhaler Commonly known as: VENTOLIN HFA Inhale 2 puffs into the lungs every 6 (six) hours as needed for wheezing or shortness of breath.   amLODipine 5 MG tablet Commonly known as: NORVASC Take 5 mg by mouth every evening.   carvedilol 6.25 MG tablet Commonly known as: COREG Take 12.5 mg by mouth at bedtime.   feeding supplement (PRO-STAT SUGAR FREE 64) Liqd Take 30 mLs by mouth daily.   furosemide 40 MG tablet Commonly known as: LASIX Take 40 mg by mouth daily.   glimepiride 1 MG tablet Commonly known as: AMARYL Take 1 mg by mouth daily.   levothyroxine 150 MCG tablet Commonly known as: SYNTHROID Take 150 mcg by mouth every evening.   NON FORMULARY DIET : Regular NAS, CCD, Heart healthy   nystatin powder Commonly known as: MYCOSTATIN/NYSTOP Apply 1 application topically 3 (three) times daily as needed for rash. Apply to groin area tid prn for yeast in groin area.   omeprazole 20 MG capsule Commonly known as: PRILOSEC Take 20 mg by mouth daily.   senna-docusate 8.6-50 MG tablet Commonly known as: Senokot-S Take 2 tablets by mouth daily as needed for mild constipation.   traMADol 50 MG tablet Commonly known as:  ULTRAM Take 50 mg by mouth daily. At Lunch Time   traMADol 50 MG tablet Commonly known as: ULTRAM Take 100 mg by mouth 2 (two) times daily.   triamcinolone cream 0.1 % Commonly known as: KENALOG Apply 1 application topically daily as needed (rash).       Review of Systems   In general she is not complaining of any fever or chills.  Skin-does have a history of rashes noted above this is being treated by wound care topical treatment.  Head ears eyes nose mouth and throat does not complain of visual changes or sore throat.  Respiratory is not complaining of shortness of breath or increased cough.  Cardiac does not complain of chest pain he has significant lymphedema her legs  wrapped this is baseline.  GI is not complaining of abdominal discomfort says she was constipated earlier this morning but was able to have a significant bowel movement.  She also had nausea but says this resolved after she had a bowel movement.  GU is not complaining of dysuria.  Musculoskeletal continues with weakness and chronic low back pain apparently this has improved somewhat with the tramadol.  Neurologic does not complain of dizziness headache numbness does have weakness of her lower extremities.  And psych is not complaining of being depressed or anxious is looking forward to being home with her husband  Vitals:   04/02/20 1510  BP: 118/70  Pulse: 70  Resp: 18  Temp: 98 F (36.7 C)  TempSrc: Oral  SpO2: 96%  Weight: 263 lb (119.3 kg)  Height: 5\' 3"  (1.6 m)   Body mass index is 46.59 kg/m. Physical Exam   In general this is a pleasant elderly female in no distress.  Her skin is warm and dry-- rash was difficult to assess secondary to patient positioning this is followed by wound care.  Eyes visual acuity appears to be intact sclera and conjunctive are clear.  Oropharynx is clear mucous membranes moist.  Chest is clear to auscultation with somewhat shallow air entry there is no  labored breathing.  Heart is regular rate and rhythm without murmur gallop or rub she has chronic lower extremity lymphedema currently her legs are wrapped.  Abdomen is obese soft nontender with active bowel sounds.  Musculoskeletal is able to move all extremities x4 with continued lower extremity weakness at this point her pain appears to be controlled.  Neurologic appears grossly intact cannot really appreciate lateralizing findings speech is clear.  Psych she is grossly alert and oriented pleasant and appropriate  Labs reviewed: Basic Metabolic Panel:  Mar 31, 2020.  Sodium 135 potassium 5.3 BUN 36.4 creatinine 1.50.  03/25/2020  WBC 8.3 hemoglobin 12.2 platelets 178  Sodium 138 potassium 5.4 BUN 34.2 creatinine 1.38   Recent Labs    11/29/19 0250 11/29/19 0250 11/30/19 0251 11/30/19 0251 12/01/19 0307 01/15/20 1610 03/18/20 0457 03/19/20 0403 03/20/20 0847  NA 137   < > 138   < > 141   < > 141 137 130*  K 4.6   < > 5.1   < > 5.4*   < > 5.0 4.8 5.8*  CL 100   < > 101   < > 103   < > 100 97* 92*  CO2 27   < > 27   < > 30   < > 33* 31 30  GLUCOSE 154*   < > 139*   < > 119*   < > 102* 90 91  BUN 23   < > 33*   < > 39*   < > 28* 31* 39*  CREATININE 1.20*   < > 1.13*   < > 1.15*   < > 1.41* 1.63* 2.07*  CALCIUM 8.3*   < > 8.3*   < > 8.8*   < > 9.2 8.4* 8.0*  MG 2.3   < > 2.2   < > 2.4  --  2.3 2.0  --   PHOS 2.6  --  3.7  --  3.6  --   --   --   --    < > = values in this interval not displayed.   Liver Function Tests: Recent Labs    01/15/20 1610 03/17/20 2331 03/18/20 0457  AST 51* 36 37  ALT 40 23 24  ALKPHOS 296* 304* 304*  BILITOT 0.7 0.8 0.9  PROT 7.3 6.9 6.9  ALBUMIN 3.0* 3.1* 2.9*   No results for input(s): LIPASE, AMYLASE in the last 8760 hours. No results for input(s): AMMONIA in the last 8760 hours. CBC: Recent Labs    12/01/19 0307 01/15/20 1610 03/17/20 2331 03/18/20 0457 03/19/20 0403  WBC 11.7*   < > 11.2* 10.4 10.4  NEUTROABS 8.6*   --  6.7 6.4  --   HGB 13.3   < > 12.0 12.0 11.0*  HCT 43.3   < > 38.7 38.4 35.7*  MCV 97.5   < > 99.0 99.0 99.4  PLT 236   < > 244 229 213   < > = values in this interval not displayed.   Cardiac Enzymes: No results for input(s): CKTOTAL, CKMB, CKMBINDEX, TROPONINI in the last 8760 hours. BNP: Invalid input(s): POCBNP CBG: Recent Labs    03/20/20 0806 03/20/20 0906 03/20/20 1202  GLUCAP 66* 80 118*    Procedures and Imaging Studies During Stay: CT CHEST WO CONTRAST  Result Date: 03/18/2020 CLINICAL DATA:  Pain from the left  shoulder bleed to the left hip for 2 weeks. EXAM: CT CHEST WITHOUT CONTRAST TECHNIQUE: Multidetector CT imaging of the chest was performed following the standard protocol without IV contrast. COMPARISON:  November 27, 2019 FINDINGS: Cardiovascular: There is moderate severity calcification of the aortic arch. Normal heart size. No pericardial effusion. Marked severity coronary artery calcification is seen. Mediastinum/Nodes: No enlarged mediastinal or axillary lymph nodes. Lungs/Pleura: A new 5 mm noncalcified lung nodule is seen within the anterolateral aspect of the right upper lobe (axial CT image 55, CT series number 5). 6 mm and 7 mm noncalcified lung nodule seen within the posterior aspect of the left apex. This represents a new finding when compared to the prior exam. A stable 6 mm noncalcified lung nodule is seen within the posterior aspect of the left lower lobe (axial CT image 70, CT series number 5). A 6 mm noncalcified lung nodule is seen within the anterolateral aspect of the left lower lobe (axial CT image 82, CT series number 5). This represents a new finding when compared to the prior study. Stable 4 mm and 5 mm noncalcified lung nodules are seen within the right middle lobe. A small patchy area of atelectasis and/or early infiltrate is seen within the posteromedial aspect of the left lower lobe. There is no evidence of a pleural effusion or pneumothorax. Upper  Abdomen: There is a moderate-sized hiatal hernia. Musculoskeletal: Multilevel degenerative changes seen throughout the thoracic spine. IMPRESSION: 1. Multiple new subcentimeter bilateral noncalcified lung nodules. Correlation with 3 month follow-up chest CT is recommended to determine stability. 2. Small patchy area of atelectasis and/or early infiltrate within the posteromedial aspect of the left lower lobe. 3. Moderate-sized hiatal hernia. Aortic Atherosclerosis (ICD10-I70.0). Electronically Signed   By: Aram Candela M.D.   On: 03/18/2020 04:06   CT THORACIC SPINE WO CONTRAST  Result Date: 03/18/2020 CLINICAL DATA:  Back pain EXAM: CT THORACIC AND LUMBAR SPINE WITHOUT CONTRAST TECHNIQUE: Multidetector CT imaging of the thoracic and lumbar spine was performed without contrast. Multiplanar CT image reconstructions were also generated. COMPARISON:  CTA chest 11/27/2019, CT abdomen pelvis 08/08/2019 FINDINGS: CT THORACIC SPINE FINDINGS Alignment: Mild right convex asymmetry Vertebrae: Bones are diffusely osteopenic. At the T11 and T12 levels, there is lucency of the anterior vertebral bodies. Paraspinal and other soft tissues: Please see report for dedicated CT of the chest. Disc levels: There is no bony spinal canal stenosis. CT LUMBAR SPINE FINDINGS Segmentation: 5 lumbar type vertebrae. Alignment: Grade 1 retrolisthesis at L3-4 and grade 1 anterolisthesis at L4-5. Mild left convex asymmetry. Vertebrae: Osteopenia. Bulky osteophytes at L1-2, L2-3 and L3-4. There are large areas of lucency within the posterior aspect of the L4 and L5 vertebrae. Paraspinal and other soft tissues: Calcific aortic atherosclerosis. Disc levels: L1-2: Partially calcified disc material and endplate spurring with mild spinal canal stenosis. L2-3: Endplate spurring with mild spinal canal stenosis. L3-4: Severe facet hypertrophy. Disc bulge with endplate spurring. Severe spinal canal stenosis. L4-5: Severe facet hypertrophy with disc  bulge and endplate spurring. Moderate spinal canal stenosis. L5-S1: No bony spinal canal stenosis. IMPRESSION: 1. Generalized osteopenia with multiple areas of somewhat focal demineralization. This appearance is exaggerated by photon starvation artifacts due to patient body habitus. Within these limitations, no acute fracture. 2. Severe multilevel degenerative disc disease with bulky osteophytosis causing fusion of long segments of the lower thoracic and lumbar spine. 3. Multilevel moderate-to-severe lumbar spinal canal stenosis. 4.  Aortic atherosclerosis (ICD10-I70.0). Electronically Signed   By: Deatra Robinson  M.D.   On: 03/18/2020 04:04   CT LUMBAR SPINE WO CONTRAST  Result Date: 03/18/2020 CLINICAL DATA:  Back pain EXAM: CT THORACIC AND LUMBAR SPINE WITHOUT CONTRAST TECHNIQUE: Multidetector CT imaging of the thoracic and lumbar spine was performed without contrast. Multiplanar CT image reconstructions were also generated. COMPARISON:  CTA chest 11/27/2019, CT abdomen pelvis 08/08/2019 FINDINGS: CT THORACIC SPINE FINDINGS Alignment: Mild right convex asymmetry Vertebrae: Bones are diffusely osteopenic. At the T11 and T12 levels, there is lucency of the anterior vertebral bodies. Paraspinal and other soft tissues: Please see report for dedicated CT of the chest. Disc levels: There is no bony spinal canal stenosis. CT LUMBAR SPINE FINDINGS Segmentation: 5 lumbar type vertebrae. Alignment: Grade 1 retrolisthesis at L3-4 and grade 1 anterolisthesis at L4-5. Mild left convex asymmetry. Vertebrae: Osteopenia. Bulky osteophytes at L1-2, L2-3 and L3-4. There are large areas of lucency within the posterior aspect of the L4 and L5 vertebrae. Paraspinal and other soft tissues: Calcific aortic atherosclerosis. Disc levels: L1-2: Partially calcified disc material and endplate spurring with mild spinal canal stenosis. L2-3: Endplate spurring with mild spinal canal stenosis. L3-4: Severe facet hypertrophy. Disc bulge with  endplate spurring. Severe spinal canal stenosis. L4-5: Severe facet hypertrophy with disc bulge and endplate spurring. Moderate spinal canal stenosis. L5-S1: No bony spinal canal stenosis. IMPRESSION: 1. Generalized osteopenia with multiple areas of somewhat focal demineralization. This appearance is exaggerated by photon starvation artifacts due to patient body habitus. Within these limitations, no acute fracture. 2. Severe multilevel degenerative disc disease with bulky osteophytosis causing fusion of long segments of the lower thoracic and lumbar spine. 3. Multilevel moderate-to-severe lumbar spinal canal stenosis. 4.  Aortic atherosclerosis (ICD10-I70.0). Electronically Signed   By: Deatra Robinson M.D.   On: 03/18/2020 04:04   DG Chest Portable 1 View  Result Date: 03/17/2020 CLINICAL DATA:  Peripheral edema EXAM: PORTABLE CHEST 1 VIEW COMPARISON:  January 15, 2020 FINDINGS: The heart size and mediastinal contours are within normal limits. Mildly increased interstitial markings are seen throughout both lungs. There is also slightly patchy airspace opacity seen within the right upper lobe. Aortic knob calcifications are seen. No pleural effusion. No acute osseous abnormality. IMPRESSION: Interstitial/patchy airspace opacity within both lungs which could be due to edema and/or infectious etiology. Electronically Signed   By: Jonna Clark M.D.   On: 03/17/2020 23:20   ECHOCARDIOGRAM COMPLETE  Result Date: 03/18/2020    ECHOCARDIOGRAM REPORT   Patient Name:   Brandy Morris Lamar Date of Exam: 03/18/2020 Medical Rec #:  [id number]        Height:       62.0 in Accession #:    [id number]       Weight:       274.7 lb Date of Birth:  Sep 02, 1935       BSA:          2.188 m Patient Age:    84 years         BP:           138/48 mmHg Patient Gender: F                HR:           69 bpm. Exam Location:  Inpatient Procedure: 2D Echo, Cardiac Doppler and Color Doppler Indications:    CHF  History:        Patient has no  prior history of Echocardiogram examinations. LE  edema; Risk Factors:Hypertension, Morbid obesity and Diabetes.                 Recent COVID,.  Sonographer:    Dustin Flock Referring Phys: [id number] Vernelle Emerald  Sonographer Comments: Patient is morbidly obese. IMPRESSIONS  1. Normal LV systolic function; grade 1 diastolic dysfunction.  2. Left ventricular ejection fraction, by estimation, is 60 to 65%. The left ventricle has normal function. The left ventricle has no regional wall motion abnormalities. There is moderate left ventricular hypertrophy. Left ventricular diastolic parameters are consistent with Grade I diastolic dysfunction (impaired relaxation). Elevated left atrial pressure.  3. Right ventricular systolic function is normal. The right ventricular size is normal.  4. The mitral valve is normal in structure. No evidence of mitral valve regurgitation. No evidence of mitral stenosis.  5. The aortic valve is normal in structure. Aortic valve regurgitation is not visualized. No aortic stenosis is present.  6. The inferior vena cava is normal in size with greater than 50% respiratory variability, suggesting right atrial pressure of 3 mmHg. FINDINGS  Left Ventricle: Left ventricular ejection fraction, by estimation, is 60 to 65%. The left ventricle has normal function. The left ventricle has no regional wall motion abnormalities. The left ventricular internal cavity size was normal in size. There is  moderate left ventricular hypertrophy. Left ventricular diastolic parameters are consistent with Grade I diastolic dysfunction (impaired relaxation). Elevated left atrial pressure. Right Ventricle: The right ventricular size is normal.Right ventricular systolic function is normal. Left Atrium: Left atrial size was normal in size. Right Atrium: Right atrial size was normal in size. Pericardium: There is no evidence of pericardial effusion. Mitral Valve: The mitral valve is normal in  structure. Normal mobility of the mitral valve leaflets. Mild mitral annular calcification. No evidence of mitral valve regurgitation. No evidence of mitral valve stenosis. Tricuspid Valve: The tricuspid valve is normal in structure. Tricuspid valve regurgitation is trivial. No evidence of tricuspid stenosis. Aortic Valve: The aortic valve is normal in structure. Aortic valve regurgitation is not visualized. No aortic stenosis is present. Aortic valve mean gradient measures 9.0 mmHg. Aortic valve peak gradient measures 12.8 mmHg. Aortic valve area, by VTI measures 1.53 cm. Pulmonic Valve: The pulmonic valve was not well visualized. Pulmonic valve regurgitation is not visualized. No evidence of pulmonic stenosis. Aorta: The aortic root is normal in size and structure. Venous: The inferior vena cava is normal in size with greater than 50% respiratory variability, suggesting right atrial pressure of 3 mmHg.  Additional Comments: Normal LV systolic function; grade 1 diastolic dysfunction.  LEFT VENTRICLE PLAX 2D LVIDd:         3.60 cm  Diastology LVIDs:         2.50 cm  LV e' lateral:   6.22 cm/s LV PW:         1.40 cm  LV E/e' lateral: 21.4 LV IVS:        1.50 cm  LV e' medial:    7.84 cm/s LVOT diam:     1.90 cm  LV E/e' medial:  17.0 LV SV:         73 LV SV Index:   33 LVOT Area:     2.84 cm  RIGHT VENTRICLE RV Basal diam:  3.10 cm RV S prime:     8.70 cm/s TAPSE (M-mode): 2.4 cm LEFT ATRIUM           Index       RIGHT ATRIUM  Index LA diam:      3.20 cm 1.46 cm/m  RA Area:     19.40 cm LA Vol (A2C): 56.5 ml 25.83 ml/m RA Volume:   61.40 ml  28.07 ml/m LA Vol (A4C): 61.0 ml 27.88 ml/m  AORTIC VALVE AV Area (Vmax):    1.77 cm AV Area (Vmean):   1.43 cm AV Area (VTI):     1.53 cm AV Vmax:           179.00 cm/s AV Vmean:          141.000 cm/s AV VTI:            0.476 m AV Peak Grad:      12.8 mmHg AV Mean Grad:      9.0 mmHg LVOT Vmax:         112.00 cm/s LVOT Vmean:        71.200 cm/s LVOT VTI:           0.257 m LVOT/AV VTI ratio: 0.54  AORTA Ao Root diam: 3.10 cm MITRAL VALVE MV Area (PHT): 3.17 cm     SHUNTS MV Decel Time: 239 msec     Systemic VTI:  0.26 m MV E velocity: 133.00 cm/s  Systemic Diam: 1.90 cm MV A velocity: 139.00 cm/s MV E/A ratio:  0.96 Olga Millers MD Electronically signed by Olga Millers MD Signature Date/Time: 03/18/2020/3:50:18 PM    Final    US Abdomen Limited RUQ  Result Date: 03/18/2020 CLINICAL DATA:  Abnormal liver function tests EXAM: ULTRASOUND ABDOMEN LIMITED RIGHT UPPER QUADRANT COMPARISON:  None. FINDINGS: Gallbladder: Gallbladder is surgically absent Common bile duct: Diameter: 6 mm Liver: Coarsened hepatic echotexture with mildly nodular contour. The liver parenchyma is echogenic. Portal vein is patent on color Doppler imaging with normal direction of blood flow towards the liver. Other: None. IMPRESSION: Hepatic steatosis and possible cirrhosis. Surgically absent gallbladder without biliary dilatation. Electronically Signed   By: Deatra Robinson M.D.   On: 03/18/2020 03:01    Assessment/Plan:    #1 history of low back pain with history of lumbar stenosis and degenerative disc disease-she will need continued PT and OT as well as a nursing aide-continue with tramadol 100 mg twice daily 50 mg at lunch-will need expedient follow-up by primary care provider.  2.  History of lymphedema she is on Lasix 40 mg a day also has Unna boots-at this point appears to be stable but this is complicated somewhat with her history of chronic kidney disease.  3.  History of chronic kidney disease most recent creatinine is 1.5 which I believe is relatively her recent baseline --she also appears to have somewhat of a chronically slightly elevated potassium level-since this appears to be relatively stable will defer to primary care provider for possible medication changes.  Her lisinopril has been discontinued.  4.  History of diabetes type 2 she is on Amaryl 1 mg a day-blood sugars  appear to be stable as noted above.  5.  History of hypertension-this appears to be stable she is on Coreg 12.5 mg a day as well as Norvasc 5 mg a day her lisinopril has been discontinued.  6.-History of rash-this has been treated with nystatin-triamcinolone cream she also received a course of Diflucan oral-this will need follow-up by primary care provider-as needed.  7.  History of bilateral pulmonary infiltrates and nodules-infiltrates were thought secondary to recent COVID-19 infection-suggested for CT scan in 3 months to follow-up of pulmonary nodules.  8.  History of  hypothyroidism she is on Synthroid 150 g a day-TSH was 5.881-however T4 was  1.35-.  9 history of GERD continues on Prilosec 20 mg a day this appears to be stable.  Again she will need continued PT and OT she will have home health by [facility] will need a nursing aide as well.  She also will need a standard wheelchair with swing away leg secondary to her relative fall risk.  She does have primary care follow-up with Dr. Kristen Loader early next week   [id number]  greater than 30 minutes spent on this discharge summary-greater than 50% of time spent coordinating plan for numerous diagnoses.  ADDENDUM  04/03/2020.  We have received updated metabolic panel which shows a creatinine of 1.63 and BUN is 38.2 her sodium is 136 potassium is 5.4.  As noted above this will warrant follow-up by her primary care provider at next visit with updated labs.  Renal function and potassium level appear to be relatively within recent baselines-may possibly benefit from chronic potassium lowering medication but will defer to primary care provider since they will be following up  I have called Dr. Barnett Abu office and most appreciative of the their followup next week

## 2020-04-03 MED ORDER — FUROSEMIDE 40 MG PO TABS: 40.0000 mg | ORAL_TABLET | Freq: Every day | ORAL | 0 refills | Status: AC

## 2020-04-03 MED ORDER — CARVEDILOL 6.25 MG PO TABS: 12.5000 mg | ORAL_TABLET | Freq: Every day | ORAL | 0 refills | Status: AC

## 2020-04-03 MED ORDER — TRIAMCINOLONE ACETONIDE 0.1 % EX CREA: 1.0000 "application " | TOPICAL_CREAM | Freq: Every day | CUTANEOUS | 0 refills | Status: AC | PRN

## 2020-04-03 MED ORDER — ALBUTEROL SULFATE HFA 108 (90 BASE) MCG/ACT IN AERS: 2.0000 | INHALATION_SPRAY | Freq: Four times a day (QID) | RESPIRATORY_TRACT | 0 refills | Status: AC | PRN

## 2020-04-03 MED ORDER — GLIMEPIRIDE 1 MG PO TABS: 1.0000 mg | ORAL_TABLET | Freq: Every day | ORAL | 0 refills | Status: AC

## 2020-04-03 MED ORDER — OMEPRAZOLE 20 MG PO CPDR: 20.0000 mg | DELAYED_RELEASE_CAPSULE | Freq: Every day | ORAL | 0 refills | Status: AC

## 2020-04-03 MED ORDER — NYSTATIN 100000 UNIT/GM EX POWD: 1.0000 "application " | Freq: Three times a day (TID) | CUTANEOUS | 0 refills | Status: AC | PRN

## 2020-04-03 MED ORDER — TRAMADOL HCL 50 MG PO TABS: 100.0000 mg | ORAL_TABLET | Freq: Two times a day (BID) | ORAL | 0 refills | Status: AC

## 2020-04-03 MED ORDER — LEVOTHYROXINE SODIUM 150 MCG PO TABS: 150.0000 ug | ORAL_TABLET | Freq: Every evening | ORAL | 0 refills | Status: AC

## 2020-04-03 MED ORDER — AMLODIPINE BESYLATE 5 MG PO TABS: 5.0000 mg | ORAL_TABLET | Freq: Every evening | ORAL | 0 refills | Status: AC

## 2020-04-03 MED ORDER — TRAMADOL HCL 50 MG PO TABS: 50.0000 mg | ORAL_TABLET | Freq: Every day | ORAL | 0 refills | Status: AC

## 2020-04-04 ENCOUNTER — Encounter: Payer: Self-pay | Admitting: Internal Medicine

## 2020-04-24 ENCOUNTER — Other Ambulatory Visit: Payer: Self-pay | Admitting: Internal Medicine

## 2020-04-26 ENCOUNTER — Other Ambulatory Visit: Payer: Self-pay | Admitting: Internal Medicine

## 2020-05-20 ENCOUNTER — Other Ambulatory Visit: Payer: Self-pay | Admitting: Internal Medicine

## 2020-05-21 NOTE — Progress Notes (Signed)
This encounter was created in error - please disregard.

## 2020-07-23 DEATH — deceased

## 2022-01-02 IMAGING — CT CT ANGIO CHEST
2 of 6 series · 18 of 36 positions shown · IV contrast (omnipaque)
Comparison: Chest radiograph dated 11/27/2019 and CT dated
11/04/2008.

CLINICAL DATA: 84-year-old female with elevated D-dimer and
shortness of breath.

EXAM:
CT ANGIOGRAPHY CHEST WITH CONTRAST
TECHNIQUE: Multidetector CT imaging of the chest was performed using the
standard protocol during bolus administration of intravenous
contrast. Multiplanar CT image reconstructions and MIPs were
obtained to evaluate the vascular anatomy.
CONTRAST:  100mL OMNIPAQUE IOHEXOL 350 MG/ML SOLN

[Series 5: thins · axial · 0.62mm/px · z∈[-596,-344]mm · 17 of 285 slices shown]
[im 16/285  lung]
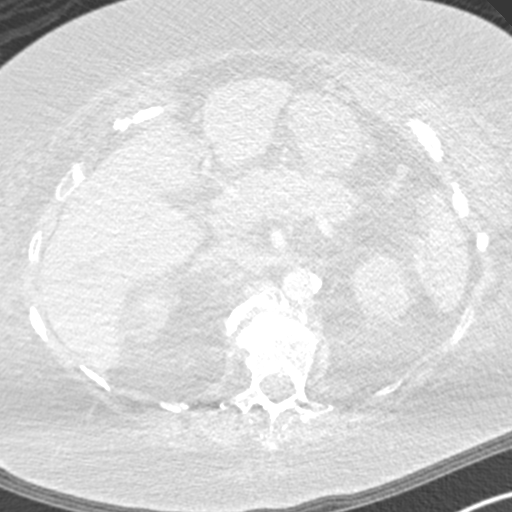
[im 32/285  mediastinal]
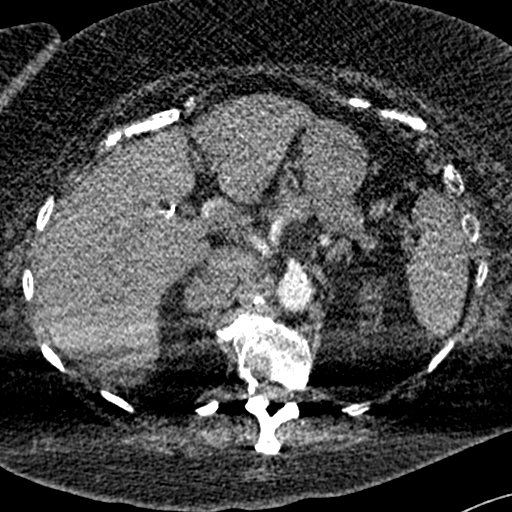
[im 48/285  lung]
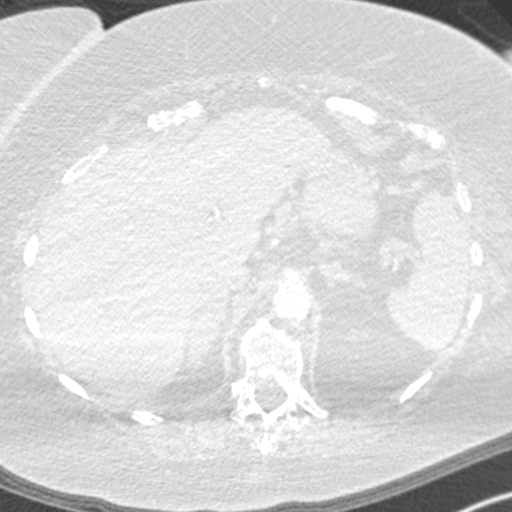
[im 64/285  mediastinal]
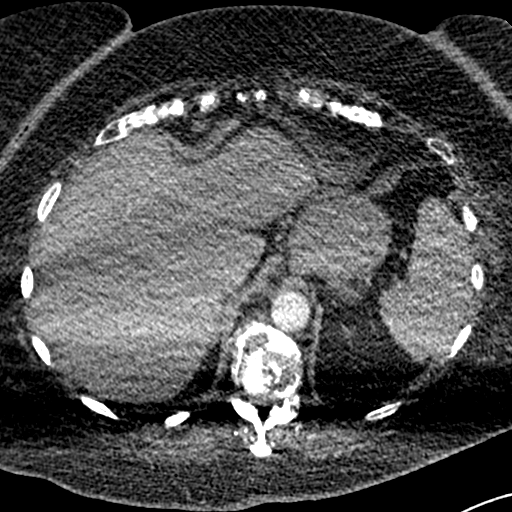
[im 79/285  lung]
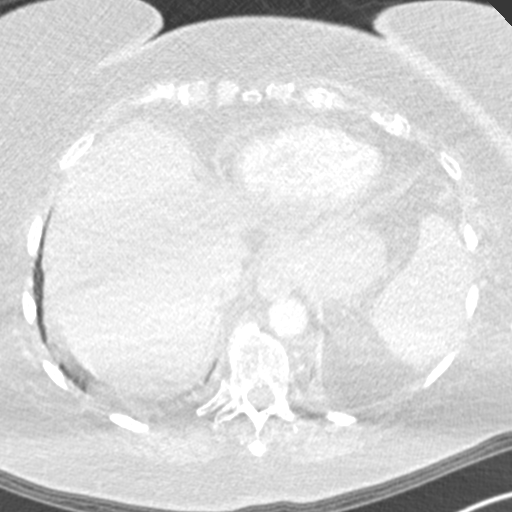
[im 95/285  mediastinal]
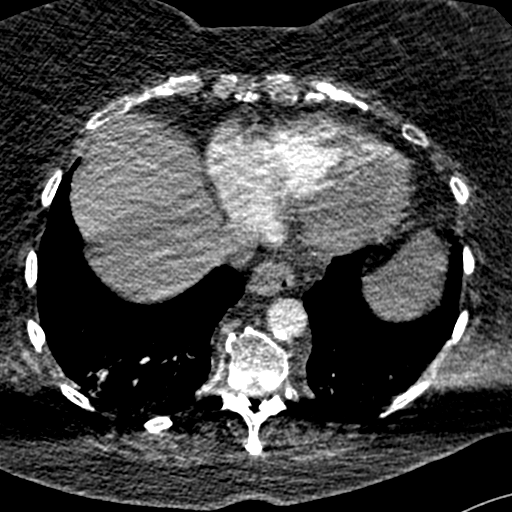
[im 111/285  lung]
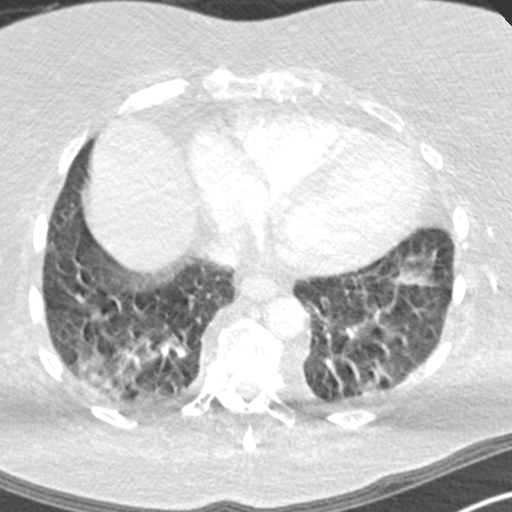
[im 127/285  mediastinal]
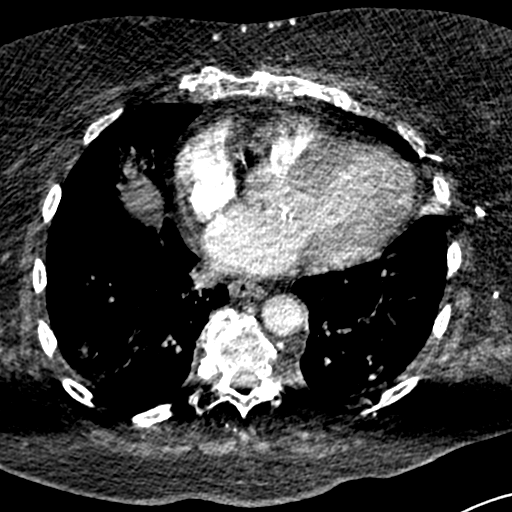
[im 143/285  lung]
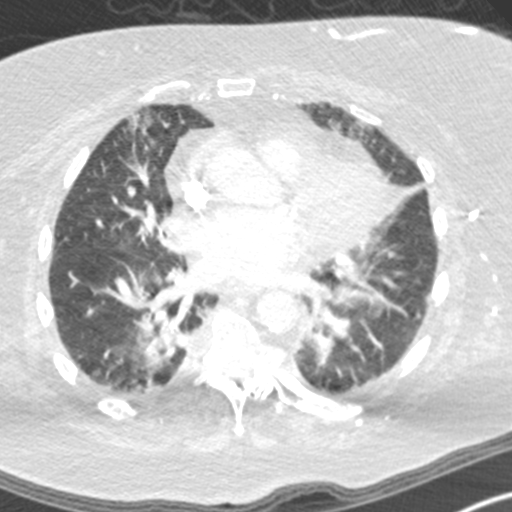
[im 158/285  mediastinal]
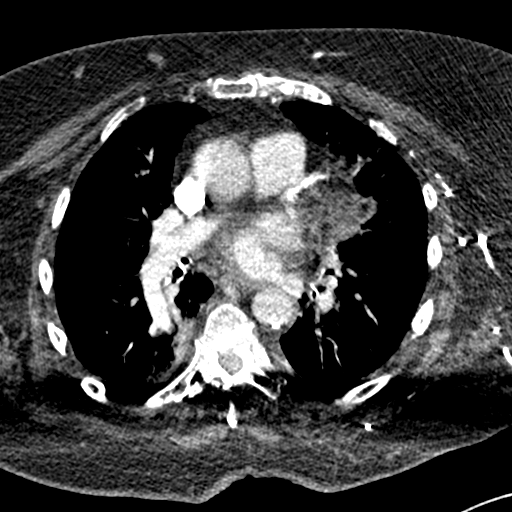
[im 174/285  lung]
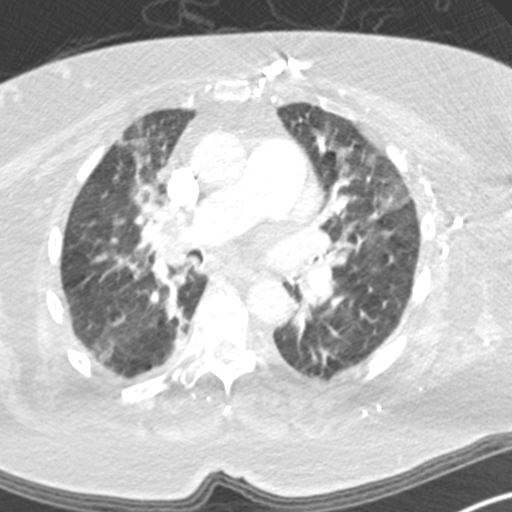
[im 190/285  mediastinal]
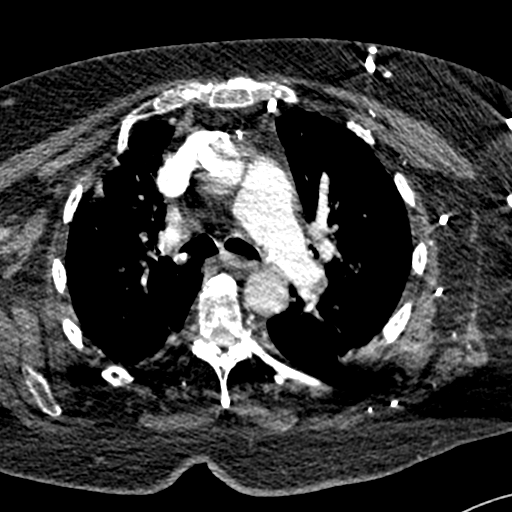
[im 206/285  lung]
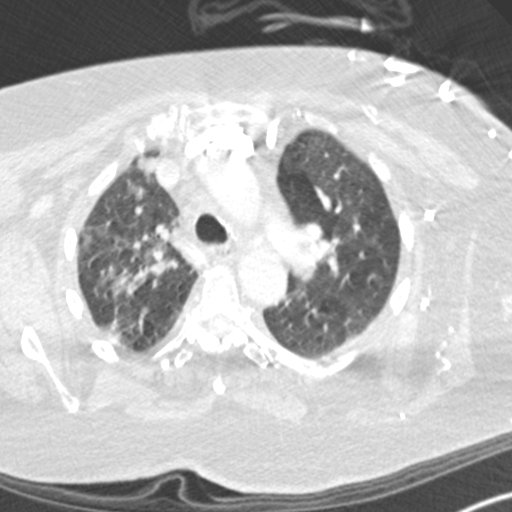
[im 221/285  mediastinal]
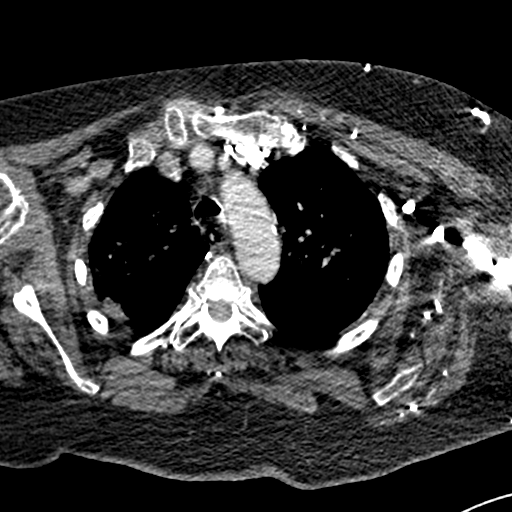
[im 237/285  lung]
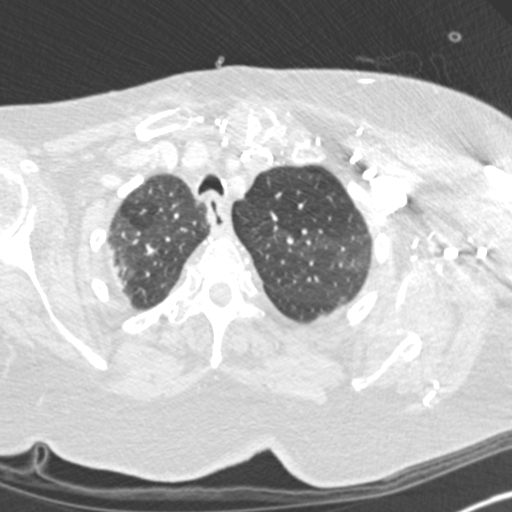
[im 253/285  mediastinal]
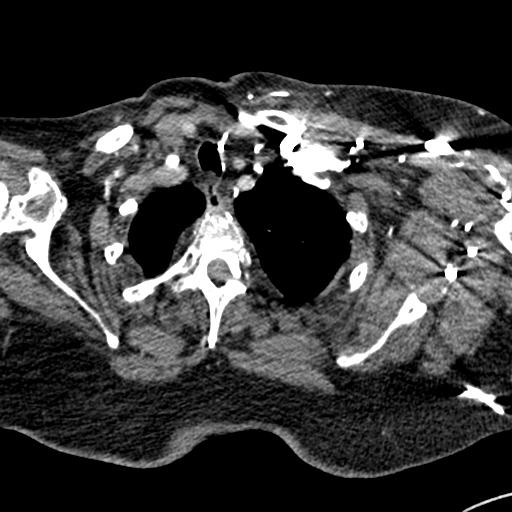
[im 269/285  lung]
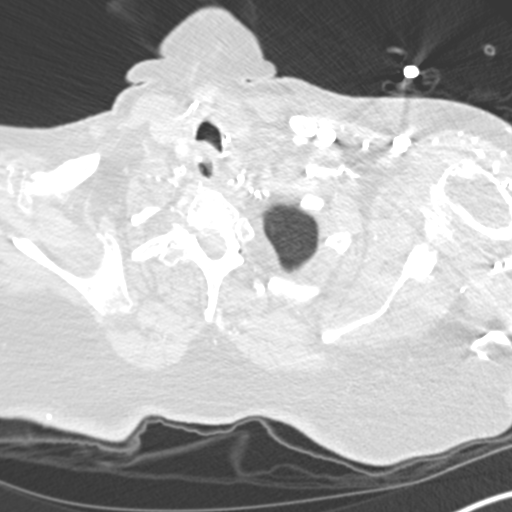

[Series 7: coronal mpr · coronal · 0.55mm/px · 1 of 126 slices shown]
[im 63/126  mediastinal]
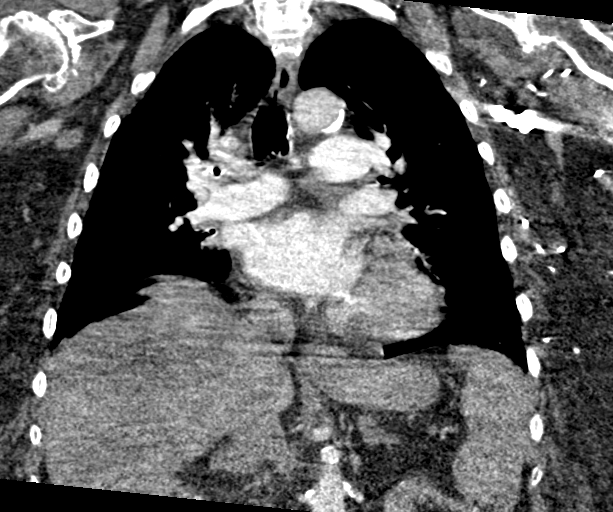

[18 of 36 positions shown; findings below may reference images not displayed]

FINDINGS: Evaluation is limited due to streak artifact caused by patient's
arms.

Cardiovascular: There is mild cardiomegaly. No pericardial effusion.
Coronary vascular calcifications noted. There is mild
atherosclerotic calcification of the thoracic aorta. Mild dilatation
of the main pulmonary trunk suggestive of a degree of pulmonary
hypertension. Clinical correlation is recommended. Evaluation of the
pulmonary arteries is very limited due to suboptimal opacification
and timing of the contrast as well as severe respiratory motion
artifact. No large or central pulmonary artery embolus identified.

Mediastinum/Nodes: No definite hilar or mediastinal adenopathy.
Small hiatal hernia. No mediastinal fluid collection.

Lungs/Pleura: Bilateral streaky airspace densities most consistent
with multifocal pneumonia, likely viral or atypical in etiology.
Clinical correlation is recommended. No pleural effusion or
pneumothorax. The central airways are patent.

Upper Abdomen: Thickened appearance of the right adrenal gland, new
since the prior CT. There is a 12 mm nodular lesion in the lateral
limb of the left adrenal gland similar to prior CT. Evaluation of
the upper abdomen is very limited on this CT due to respiratory
motion artifact. Cholecystectomy.

Musculoskeletal: Stable appearing right paraspinal 1.5 x 2.0 cm
nodular area (series 5, image 143). Degenerative changes of the
spine. No acute osseous pathology.

Review of the MIP images confirms the above findings.
IMPRESSION: 1. No CT evidence of central pulmonary artery embolus.
2. Multifocal pneumonia, likely viral or atypical in etiology.
Clinical correlation and follow-up to resolution recommended.
3. Mild cardiomegaly with coronary vascular calcifications.
4. Aortic Atherosclerosis ([id number]).

## 2022-01-02 IMAGING — DX DG CHEST 1V PORT
1 series · 1 of 1 positions shown · non-contrast
Comparison: May 10, 2019

CLINICAL DATA: Cough

EXAM:
PORTABLE CHEST 1 VIEW

[chest ap]
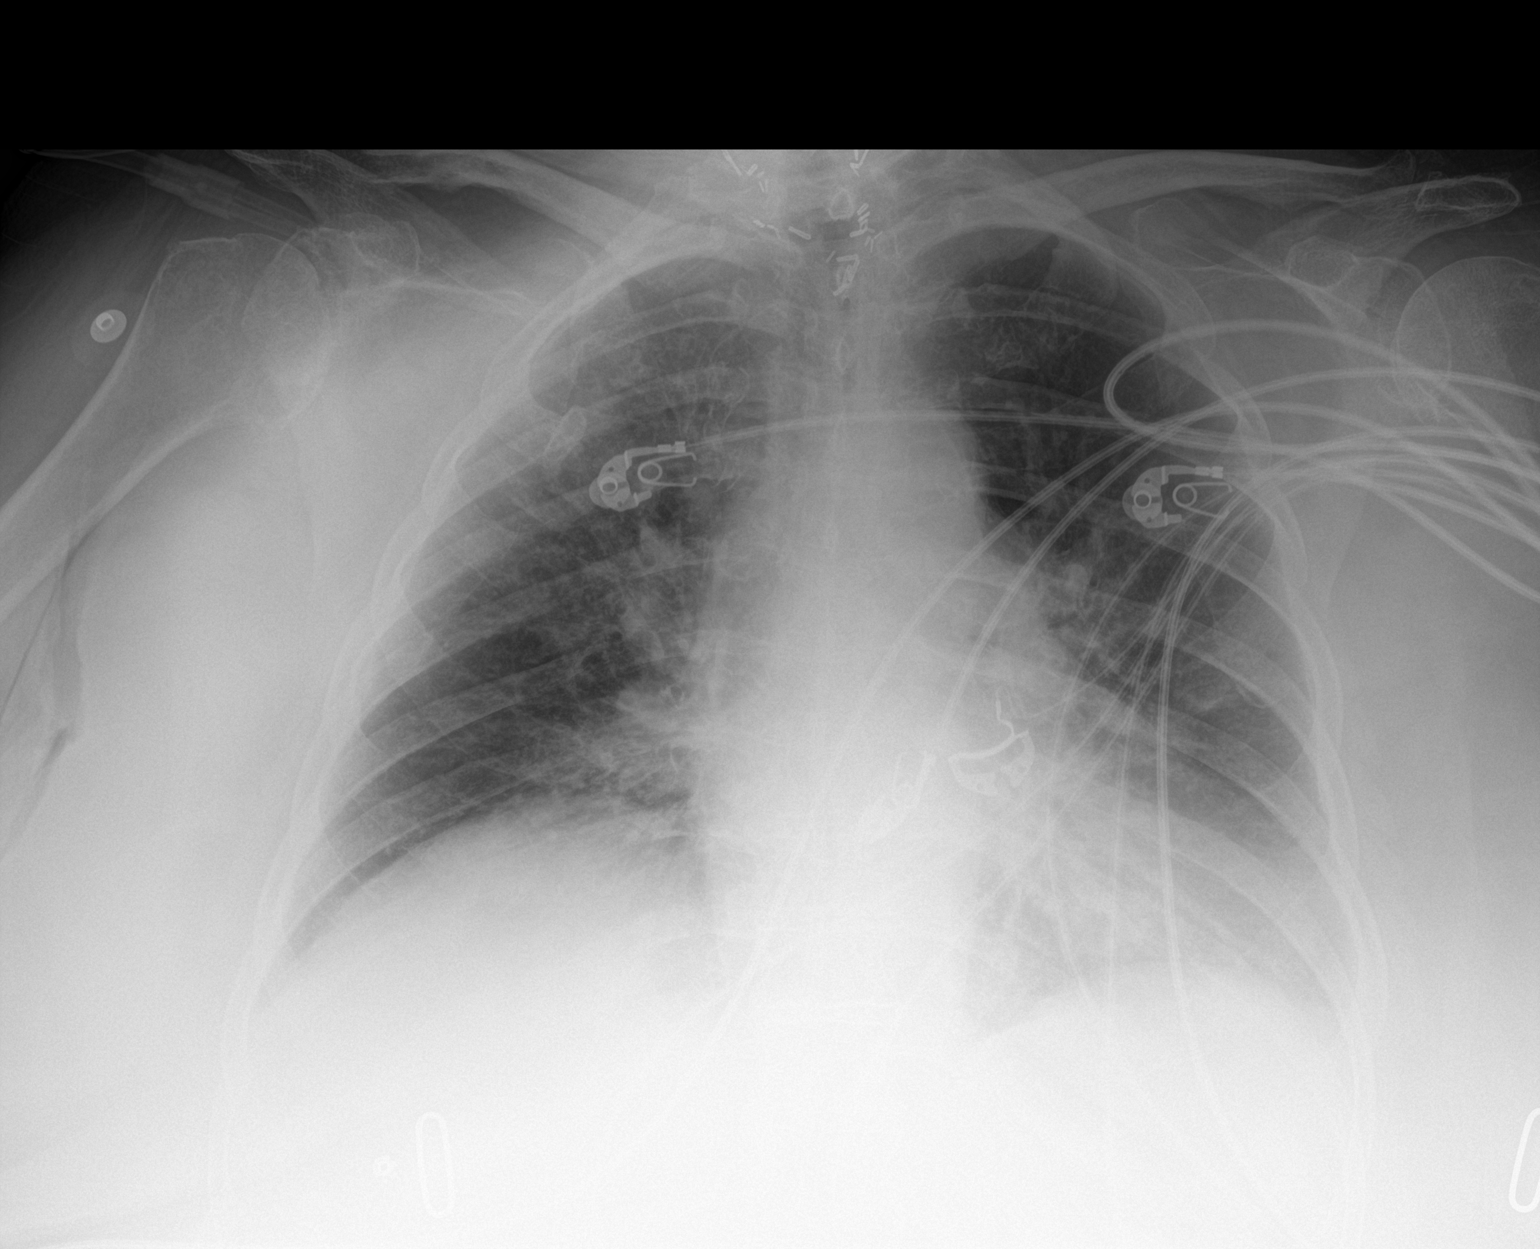

[1 of 1 positions shown; findings below may reference images not displayed]

FINDINGS: There is ill-defined opacity in the medial right base. The lungs
elsewhere are clear. Heart size and pulmonary vascularity are
normal. No adenopathy. There are surgical clips in the thyroid
region.
IMPRESSION: Ill-defined opacity medial right base, likely due to developing
pneumonia. Lungs elsewhere clear. Cardiac silhouette normal. Status
post thyroidectomy. No adenopathy evident.
